# Patient Record
Sex: Male | Born: 1960 | Race: Black or African American | Hispanic: No | Marital: Married | Smoking: Current every day smoker
Health system: Southern US, Community
[De-identification: ages and names within clinical notes are randomized; demographics above are authoritative.]

## PROBLEM LIST (undated history)

## (undated) HISTORY — PX: ANAL FISSURECTOMY: SUR608

---

## 2004-10-14 ENCOUNTER — Emergency Department: Payer: Self-pay | Admitting: Emergency Medicine

## 2006-03-24 ENCOUNTER — Emergency Department: Payer: Self-pay | Admitting: Emergency Medicine

## 2006-04-13 ENCOUNTER — Emergency Department: Payer: Self-pay | Admitting: Unknown Physician Specialty

## 2006-12-30 ENCOUNTER — Emergency Department: Payer: Self-pay | Admitting: Emergency Medicine

## 2009-01-21 ENCOUNTER — Emergency Department: Payer: Self-pay | Admitting: Emergency Medicine

## 2009-04-22 ENCOUNTER — Emergency Department: Payer: Self-pay | Admitting: Emergency Medicine

## 2010-04-06 ENCOUNTER — Ambulatory Visit: Payer: Self-pay | Admitting: Internal Medicine

## 2010-11-09 ENCOUNTER — Emergency Department: Payer: Self-pay | Admitting: Emergency Medicine

## 2010-11-20 ENCOUNTER — Emergency Department: Payer: Self-pay | Admitting: Emergency Medicine

## 2011-03-04 ENCOUNTER — Encounter: Payer: Self-pay | Admitting: *Deleted

## 2011-03-04 ENCOUNTER — Emergency Department (HOSPITAL_COMMUNITY)
Admission: EM | Admit: 2011-03-04 | Discharge: 2011-03-04 | Disposition: A | Discharge status: Home / Self Care | Payer: BC Managed Care – PPO | Attending: Emergency Medicine | Admitting: Emergency Medicine

## 2011-03-04 DIAGNOSIS — R51 Headache: Secondary | ICD-10-CM

## 2011-03-04 DIAGNOSIS — W57XXXA Bitten or stung by nonvenomous insect and other nonvenomous arthropods, initial encounter: Secondary | ICD-10-CM

## 2011-03-04 DIAGNOSIS — F17200 Nicotine dependence, unspecified, uncomplicated: Secondary | ICD-10-CM | POA: Insufficient documentation

## 2011-03-04 DIAGNOSIS — S30860A Insect bite (nonvenomous) of lower back and pelvis, initial encounter: Secondary | ICD-10-CM | POA: Insufficient documentation

## 2011-03-04 MED ORDER — PROMETHAZINE HCL 25 MG PO TABS: 25.0000 mg | ORAL_TABLET | Freq: Four times a day (QID) | ORAL | Status: AC | PRN

## 2011-03-04 MED ORDER — DOXYCYCLINE HYCLATE 100 MG PO TABS
100.0000 mg | ORAL_TABLET | Freq: Once | ORAL | Status: AC
  Administered 2011-03-04: 100 mg via ORAL
  Filled 2011-03-04: qty 1

## 2011-03-04 MED ORDER — ONDANSETRON 4 MG PO TBDP
4.0000 mg | ORAL_TABLET | Freq: Once | ORAL | Status: AC
  Administered 2011-03-04: 4 mg via ORAL
  Filled 2011-03-04: qty 1

## 2011-03-04 MED ORDER — IBUPROFEN 800 MG PO TABS: 800.0000 mg | ORAL_TABLET | Freq: Three times a day (TID) | ORAL | Status: AC

## 2011-03-04 MED ORDER — DOXYCYCLINE HYCLATE 100 MG PO CAPS: 100.0000 mg | ORAL_CAPSULE | Freq: Two times a day (BID) | ORAL | Status: AC

## 2011-03-04 MED ORDER — IBUPROFEN 800 MG PO TABS
800.0000 mg | ORAL_TABLET | Freq: Once | ORAL | Status: AC
  Administered 2011-03-04: 800 mg via ORAL
  Filled 2011-03-04: qty 1

## 2011-03-04 NOTE — ED Notes (Signed)
States has had multiple tick bites all over body.  Now experiencing headaches, nausea, muscle and joint pain, generalized weakness x 1 wk.

## 2011-03-04 NOTE — ED Notes (Signed)
Pt has several tick bites on his lower abd and one on his pelvic area.

## 2011-03-04 NOTE — ED Provider Notes (Signed)
History     Chief Complaint  Patient presents with  . tick bite    HPI Comments: Patient states that approximately one to 2 weeks ago he suffered a tick bite to his right periumbilical area in his left groin. He was able to pull these ticks off and it wasn't until the last 2-3 days that he started to develop mild headaches, cold sweats, nausea. Symptoms are constant, nothing makes it better or worse, not associated with fevers, vomiting, diarrhea, rashes. Symptoms are mild at this time.  The history is provided by the patient.    History reviewed. No pertinent past medical history.  History reviewed. No pertinent past surgical history.  No family history on file.  History  Substance Use Topics  . Smoking status: Current Everyday Smoker  . Smokeless tobacco: Not on file  . Alcohol Use: No      Review of Systems  All other systems reviewed and are negative.    Physical Exam  BP 128/81  Pulse 78  Temp(Src) 100.2 F (37.9 C) (Oral)  Resp 18  Ht 6\' 2"  (1.88 m)  Wt 235 lb (106.595 kg)  BMI 30.17 kg/m2  SpO2 100%  Physical Exam  Nursing note and vitals reviewed. Constitutional: He appears well-developed and well-nourished. No distress.  HENT:  Head: Normocephalic and atraumatic.  Mouth/Throat: Oropharynx is clear and moist. No oropharyngeal exudate.  Eyes: Conjunctivae and EOM are normal. Pupils are equal, round, and reactive to light. Right eye exhibits no discharge. Left eye exhibits no discharge. No scleral icterus.  Neck: Normal range of motion. Neck supple. No JVD present. No thyromegaly present.  Cardiovascular: Normal rate, regular rhythm, normal heart sounds and intact distal pulses.  Exam reveals no gallop and no friction rub.   No murmur heard. Pulmonary/Chest: Effort normal and breath sounds normal. No respiratory distress. He has no wheezes. He has no rales.  Abdominal: Soft. Bowel sounds are normal. He exhibits no distension and no mass. There is no  tenderness.  Musculoskeletal: Normal range of motion. He exhibits no edema and no tenderness.  Lymphadenopathy:    He has no cervical adenopathy.  Neurological: He is alert. Coordination normal.  Skin: Skin is warm and dry. He is not diaphoretic. No erythema.       Small areas of scabbed over healing at the right periumbilical area. No signs of rash in the groin. No other rashes on the skin.  Psychiatric: He has a normal mood and affect. His behavior is normal.    ED Course  Procedures  MDM Overall patient appears well, normal blood pressure, normal pulse, temperature of 100.2. We'll have to treat with doxycycline for possible [facility] spotted fever, however patient appears well and is taking by mouth fluids and foods and medications as needed. Medicine for nausea given in the emergency department as well as doxycycline. Prescriptions for same for home. Patient instructed on indications for emergency return.      Vida Roller, MD 03/04/11 [phone]

## 2012-02-26 ENCOUNTER — Ambulatory Visit: Payer: Self-pay | Admitting: Emergency Medicine

## 2012-03-03 LAB — PATHOLOGY REPORT

## 2013-06-13 ENCOUNTER — Emergency Department: Payer: Self-pay | Admitting: Emergency Medicine

## 2014-08-16 ENCOUNTER — Emergency Department: Payer: Self-pay | Admitting: Emergency Medicine

## 2014-11-30 NOTE — Op Note (Signed)
PATIENT NAME:  Parker Conway, Parker Conway MR#:  [id number] DATE OF BIRTH:  05-23-1961  DATE OF PROCEDURE:  02/26/2012  PREOPERATIVE DIAGNOSIS: Hemorrhoids.   POSTOPERATIVE DIAGNOSIS: Hemorrhoids.   PROCEDURE: Hemorrhoidectomy.   SURGEON: Alden Feagan S. Phylis Bougie, MD  DESCRIPTION OF PROCEDURE: Patient had a spinal anesthesia. He was then put back in jackknife position. Rectal examination was performed. There was one hemorrhoid at 5:00 position, was pretty prolapsed and also had prolapsed mucosa. It was then picked up with an Allis clamp and suture ligated at the apex and after that with the Harmonic scalpel whole of the hemorrhoid was then removed and then the mucosa was then sutured back all the way down to the skin.   Another area at 9:00 position was also then grasped and was removed in similar fashion and ligated all the way down to the skin level. After that I did not see any other major ones inside so the procedure was then terminated and then rectum was then packed with Gelfoam gauze and Xylocaine viscous. Patient tolerated procedure well, sent to recovery room in satisfactory condition.   ____________________________ Welford Roche Phylis Bougie, MD msh:cms D: 02/26/2012 15:07:53 ET T: 02/26/2012 15:14:20 ET JOB#: [id number]  cc: Cailey Trigueros S. Phylis Bougie, MD, <Dictator>  Sharene Butters MD ELECTRONICALLY SIGNED 03/04/2012 9:35

## 2016-05-24 ENCOUNTER — Ambulatory Visit
Admission: RE | Admit: 2016-05-24 | Discharge: 2016-05-24 | Disposition: A | Discharge status: Home / Self Care | Payer: BLUE CROSS/BLUE SHIELD | Source: Ambulatory Visit | Attending: Internal Medicine | Admitting: Internal Medicine

## 2016-05-24 ENCOUNTER — Other Ambulatory Visit: Payer: Self-pay | Admitting: Internal Medicine

## 2016-05-24 DIAGNOSIS — R079 Chest pain, unspecified: Secondary | ICD-10-CM | POA: Diagnosis not present

## 2016-05-24 DIAGNOSIS — G43019 Migraine without aura, intractable, without status migrainosus: Secondary | ICD-10-CM | POA: Diagnosis not present

## 2016-05-24 DIAGNOSIS — R05 Cough: Secondary | ICD-10-CM | POA: Diagnosis not present

## 2016-05-24 DIAGNOSIS — R0789 Other chest pain: Secondary | ICD-10-CM | POA: Diagnosis not present

## 2016-05-24 DIAGNOSIS — R52 Pain, unspecified: Secondary | ICD-10-CM

## 2016-05-24 DIAGNOSIS — R091 Pleurisy: Secondary | ICD-10-CM | POA: Diagnosis not present

## 2016-05-24 DIAGNOSIS — E784 Other hyperlipidemia: Secondary | ICD-10-CM | POA: Diagnosis not present

## 2016-05-24 DIAGNOSIS — R454 Irritability and anger: Secondary | ICD-10-CM | POA: Diagnosis not present

## 2016-05-24 DIAGNOSIS — S83509A Sprain of unspecified cruciate ligament of unspecified knee, initial encounter: Secondary | ICD-10-CM | POA: Diagnosis not present

## 2016-05-29 DIAGNOSIS — G43019 Migraine without aura, intractable, without status migrainosus: Secondary | ICD-10-CM | POA: Diagnosis not present

## 2016-05-29 DIAGNOSIS — R091 Pleurisy: Secondary | ICD-10-CM | POA: Diagnosis not present

## 2016-05-29 DIAGNOSIS — R079 Chest pain, unspecified: Secondary | ICD-10-CM | POA: Diagnosis not present

## 2016-05-29 DIAGNOSIS — R454 Irritability and anger: Secondary | ICD-10-CM | POA: Diagnosis not present

## 2016-07-21 ENCOUNTER — Encounter: Payer: Self-pay | Admitting: Emergency Medicine

## 2016-07-21 ENCOUNTER — Emergency Department
Admission: EM | Admit: 2016-07-21 | Discharge: 2016-07-22 | Disposition: A | Discharge status: Home / Self Care | Payer: BLUE CROSS/BLUE SHIELD | Attending: Emergency Medicine | Admitting: Emergency Medicine

## 2016-07-21 ENCOUNTER — Emergency Department: Payer: BLUE CROSS/BLUE SHIELD

## 2016-07-21 DIAGNOSIS — Y92009 Unspecified place in unspecified non-institutional (private) residence as the place of occurrence of the external cause: Secondary | ICD-10-CM | POA: Diagnosis not present

## 2016-07-21 DIAGNOSIS — M25511 Pain in right shoulder: Secondary | ICD-10-CM | POA: Insufficient documentation

## 2016-07-21 DIAGNOSIS — S4991XA Unspecified injury of right shoulder and upper arm, initial encounter: Secondary | ICD-10-CM | POA: Diagnosis not present

## 2016-07-21 DIAGNOSIS — F17210 Nicotine dependence, cigarettes, uncomplicated: Secondary | ICD-10-CM | POA: Diagnosis not present

## 2016-07-21 DIAGNOSIS — W01198A Fall on same level from slipping, tripping and stumbling with subsequent striking against other object, initial encounter: Secondary | ICD-10-CM | POA: Diagnosis not present

## 2016-07-21 DIAGNOSIS — Y999 Unspecified external cause status: Secondary | ICD-10-CM | POA: Diagnosis not present

## 2016-07-21 DIAGNOSIS — Y9389 Activity, other specified: Secondary | ICD-10-CM | POA: Insufficient documentation

## 2016-07-21 MED ORDER — IBUPROFEN 800 MG PO TABS
ORAL_TABLET | ORAL | Status: AC
  Filled 2016-07-21: qty 1

## 2016-07-21 MED ORDER — IBUPROFEN 800 MG PO TABS
800.0000 mg | ORAL_TABLET | Freq: Once | ORAL | Status: AC
  Administered 2016-07-21: 800 mg via ORAL

## 2016-07-21 NOTE — ED Triage Notes (Signed)
Pt says he was doing some remodeling at his house yesterday; threw some linoleum outside that got covered up in the snow; slipped and fell on it earlier today and heard a popping noise in right clavicle area; tenderness on palpation; denies tenderness to shoulder;

## 2016-07-22 ENCOUNTER — Emergency Department: Payer: BLUE CROSS/BLUE SHIELD

## 2016-07-22 DIAGNOSIS — S4991XA Unspecified injury of right shoulder and upper arm, initial encounter: Secondary | ICD-10-CM | POA: Diagnosis not present

## 2016-07-22 DIAGNOSIS — M25511 Pain in right shoulder: Secondary | ICD-10-CM | POA: Diagnosis not present

## 2016-07-22 MED ORDER — LIDOCAINE 5 % EX PTCH
1.0000 | MEDICATED_PATCH | CUTANEOUS | Status: DC
  Administered 2016-07-22: 1 via TRANSDERMAL
  Filled 2016-07-22: qty 1

## 2016-07-22 MED ORDER — OXYCODONE-ACETAMINOPHEN 5-325 MG PO TABS
1.0000 | ORAL_TABLET | Freq: Four times a day (QID) | ORAL | 0 refills | Status: DC | PRN
Start: 2016-07-22 — End: 2020-02-08

## 2016-07-22 MED ORDER — TRAMADOL HCL 50 MG PO TABS
50.0000 mg | ORAL_TABLET | Freq: Once | ORAL | Status: AC
  Administered 2016-07-22: 50 mg via ORAL
  Filled 2016-07-22: qty 1

## 2016-07-22 MED ORDER — LIDOCAINE 5 % EX PTCH: 1.0000 | MEDICATED_PATCH | Freq: Two times a day (BID) | CUTANEOUS | 0 refills | Status: AC

## 2016-07-22 NOTE — ED Notes (Signed)
Pt back from x-ray.

## 2016-07-22 NOTE — ED Notes (Signed)
Pt alert and oriented X4, active, cooperative, pt in NAD. RR even and unlabored, color WNL.  Pt informed to return if any life threatening symptoms occur.   

## 2016-07-22 NOTE — Discharge Instructions (Signed)
Please follow up with the orthopedic surgeon for further evaluation.

## 2016-07-22 NOTE — ED Provider Notes (Signed)
[facility] Emergency Department Provider Note   ____________________________________________   First MD Initiated Contact with Patient 07/21/16 2350     (approximate)  I have reviewed the triage vital signs and the nursing notes.   HISTORY  Chief Complaint Clavicle Injury and Fall    HPI Parker Conway is a 55 y.o. male who comes into the hospital today with some right shoulder pain. The patient reports that he fell on his shoulder around 2 PM. He states that he had done some remodeling in his home and had thrown some linoleum outside. He reports that it was covered in some notes today and he didn't realize that when he went outside. He stepped on the snow covered linoleum flipped twice. He reports that the second time he fell and hit his shoulder and heard a pop. He has had pain since then. The patient tried laying down but did not take anything for pain and did not ice the shoulder. The patient rates his pain as a 10 out of 10 in intensity. He is able to move his hand and his fingers but reports that it hurts too much to move his shoulder. He states that it's in his right posterior shoulder where the pain is located. The patient denies hitting his head. He is here for evaluation.   History reviewed. No pertinent past medical history.  There are no active problems to display for this patient.   Past Surgical History:  Procedure Laterality Date  . ANAL FISSURECTOMY      Prior to Admission medications   Medication Sig Start Date End Date Taking? Authorizing Provider  lidocaine (LIDODERM) 5 % Place 1 patch onto the skin every 12 (twelve) hours. Remove & Discard patch within 12 hours or as directed by MD 07/22/16 07/22/17  Loney Hering, MD  oxyCODONE-acetaminophen (ROXICET) 5-325 MG tablet Take 1 tablet by mouth every 6 (six) hours as needed. 07/22/16   Loney Hering, MD    Allergies Patient has no known allergies.  History reviewed. No  pertinent family history.  Social History Social History  Substance Use Topics  . Smoking status: Current Every Day Smoker    Types: Cigarettes  . Smokeless tobacco: Never Used  . Alcohol use No    Review of Systems Constitutional: No fever/chills Eyes: No visual changes. ENT: No sore throat. Cardiovascular: Denies chest pain. Respiratory: Denies shortness of breath. Gastrointestinal: No abdominal pain.  No nausea, no vomiting.  No diarrhea.  No constipation. Genitourinary: Negative for dysuria. Musculoskeletal: Right shoulder pain Skin: Negative for rash. Neurological: Negative for headaches, focal weakness or numbness.  10-point ROS otherwise negative.  ____________________________________________   PHYSICAL EXAM:  VITAL SIGNS: ED Triage Vitals [07/21/16 2333]  Enc Vitals Group     BP (!) 156/85     Pulse Rate 71     Resp 20     Temp 98 F (36.7 C)     Temp Source Oral     SpO2 96 %     Weight 235 lb (106.6 kg)     Height 6\' 2"  (1.88 m)     Head Circumference      Peak Flow      Pain Score 10     Pain Loc      Pain Edu?      Excl. in [place]?     Constitutional: Alert and oriented. Well appearing and in mild distress. Eyes: Conjunctivae are normal. PERRL. EOMI. Head: Atraumatic. Nose: No  congestion/rhinnorhea. Mouth/Throat: Mucous membranes are moist.  Oropharynx non-erythematous. Cardiovascular: Normal rate, regular rhythm. Grossly normal heart sounds.  Good peripheral circulation. Respiratory: Normal respiratory effort.  No retractions. Lungs CTAB. Gastrointestinal: Soft and nontender. No distention. Positive bowel sounds Musculoskeletal: Tenderness to palpation of right anterior shoulder over the acromioclavicular joint. Pain with passive range of motion  Neurologic:  Normal speech and language.  Skin:  Skin is warm, dry and intact.  Psychiatric: Mood and affect are normal.  ____________________________________________   LABS (all labs ordered are  listed, but only abnormal results are displayed)  Labs Reviewed - No data to display ____________________________________________  EKG  none ____________________________________________  RADIOLOGY  Right clavicle xray Right shoulder xray ____________________________________________   PROCEDURES  Procedure(s) performed: None  Procedures  Critical Care performed: No  ____________________________________________   INITIAL IMPRESSION / ASSESSMENT AND PLAN / ED COURSE  Pertinent labs & imaging results that were available during my care of the patient were reviewed by me and considered in my medical decision making (see chart for details).  This is a 55 year old male who comes into the hospital today with some right-sided shoulder pain after a fall on the ice. The patient did receive a clavicle x-ray that was unremarkable I will also send the patient for right shoulder x-ray. I will give him some tramadol and place a Lidoderm patch to his shoulder.  Clinical Course as of Jul 22 110  Sat Jul 21, 2016  2356 No right clavicle fracture DG Clavicle Right [AW]  Sun Jul 22, 2016  0108 No fracture or subluxation of the shoulder.  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   DG Shoulder Right [AW]    Clinical Course User Index [AW] Loney Hering, MD   The patient will be discharged to home to follow-up with the orthopedic surgeon. He has no acute fracture but again I am concerned about a possible acromioclavicular joint separation. I discussed this with the patient. He was placed in a sling and he will be discharged.  ____________________________________________   FINAL CLINICAL IMPRESSION(S) / ED DIAGNOSES  Final diagnoses:  Acute pain of right shoulder      NEW MEDICATIONS STARTED DURING THIS VISIT:  New Prescriptions   LIDOCAINE (LIDODERM) 5 %    Place 1 patch onto the skin every 12 (twelve) hours. Remove & Discard patch within  12 hours or as directed by MD   OXYCODONE-ACETAMINOPHEN (ROXICET) 5-325 MG TABLET    Take 1 tablet by mouth every 6 (six) hours as needed.     Note:  This document was prepared using Dragon voice recognition software and may include unintentional dictation errors.    Loney Hering, MD 07/22/16 0111

## 2017-12-31 DIAGNOSIS — E7849 Other hyperlipidemia: Secondary | ICD-10-CM | POA: Diagnosis not present

## 2017-12-31 DIAGNOSIS — R454 Irritability and anger: Secondary | ICD-10-CM | POA: Diagnosis not present

## 2017-12-31 DIAGNOSIS — I10 Essential (primary) hypertension: Secondary | ICD-10-CM | POA: Diagnosis not present

## 2017-12-31 DIAGNOSIS — Z125 Encounter for screening for malignant neoplasm of prostate: Secondary | ICD-10-CM | POA: Diagnosis not present

## 2017-12-31 DIAGNOSIS — E034 Atrophy of thyroid (acquired): Secondary | ICD-10-CM | POA: Diagnosis not present

## 2017-12-31 DIAGNOSIS — S83509A Sprain of unspecified cruciate ligament of unspecified knee, initial encounter: Secondary | ICD-10-CM | POA: Diagnosis not present

## 2017-12-31 DIAGNOSIS — E785 Hyperlipidemia, unspecified: Secondary | ICD-10-CM | POA: Diagnosis not present

## 2017-12-31 DIAGNOSIS — R5381 Other malaise: Secondary | ICD-10-CM | POA: Diagnosis not present

## 2017-12-31 DIAGNOSIS — G43019 Migraine without aura, intractable, without status migrainosus: Secondary | ICD-10-CM | POA: Diagnosis not present

## 2018-01-30 DIAGNOSIS — E785 Hyperlipidemia, unspecified: Secondary | ICD-10-CM | POA: Diagnosis not present

## 2018-01-30 DIAGNOSIS — M12811 Other specific arthropathies, not elsewhere classified, right shoulder: Secondary | ICD-10-CM | POA: Diagnosis not present

## 2018-01-30 DIAGNOSIS — N529 Male erectile dysfunction, unspecified: Secondary | ICD-10-CM | POA: Diagnosis not present

## 2018-01-30 DIAGNOSIS — G43019 Migraine without aura, intractable, without status migrainosus: Secondary | ICD-10-CM | POA: Diagnosis not present

## 2018-01-30 DIAGNOSIS — R454 Irritability and anger: Secondary | ICD-10-CM | POA: Diagnosis not present

## 2018-03-30 DIAGNOSIS — S025XXB Fracture of tooth (traumatic), initial encounter for open fracture: Secondary | ICD-10-CM | POA: Diagnosis not present

## 2018-03-30 DIAGNOSIS — K0889 Other specified disorders of teeth and supporting structures: Secondary | ICD-10-CM | POA: Diagnosis not present

## 2020-02-04 ENCOUNTER — Emergency Department
Admission: EM | Admit: 2020-02-04 | Discharge: 2020-02-04 | Disposition: A | Discharge status: Home / Self Care | Payer: 59 | Attending: Emergency Medicine | Admitting: Emergency Medicine

## 2020-02-04 ENCOUNTER — Encounter: Payer: Self-pay | Admitting: Emergency Medicine

## 2020-02-04 ENCOUNTER — Other Ambulatory Visit: Payer: Self-pay

## 2020-02-04 DIAGNOSIS — F17210 Nicotine dependence, cigarettes, uncomplicated: Secondary | ICD-10-CM | POA: Insufficient documentation

## 2020-02-04 DIAGNOSIS — Z136 Encounter for screening for cardiovascular disorders: Secondary | ICD-10-CM | POA: Insufficient documentation

## 2020-02-04 DIAGNOSIS — Z0000 Encounter for general adult medical examination without abnormal findings: Secondary | ICD-10-CM

## 2020-02-04 LAB — BASIC METABOLIC PANEL
Anion gap: 9 (ref 5–15)
BUN: 10 mg/dL (ref 6–20)
CO2: 26 mmol/L (ref 22–32)
Calcium: 8.9 mg/dL (ref 8.9–10.3)
Chloride: 106 mmol/L (ref 98–111)
Creatinine, Ser: 0.9 mg/dL (ref 0.61–1.24)
GFR calc Af Amer: >60 mL/min (ref >60)
GFR calc non Af Amer: >60 mL/min (ref >60)
Glucose, Bld: 88 mg/dL (ref 70–99)
Potassium: 4.4 mmol/L (ref 3.5–5.1)
Sodium: 141 mmol/L (ref 135–145)

## 2020-02-04 LAB — TROPONIN I (HIGH SENSITIVITY): Troponin I (High Sensitivity): 5 ng/L (ref <18)

## 2020-02-04 NOTE — ED Triage Notes (Signed)
Pt to ED via POV stating that he was sent by his dentist. Pt reports that he was having dental work done this morning, they had him on a cardiac monitor and they told him that it looked like he was having a heart attack. Pt brought tracing in from dentist. Pt states that he is not having any chest pain, nor has he had chest pain at any point.

## 2020-02-04 NOTE — ED Notes (Signed)
See triage note, pt reports he was at dentist office and the dentist said he had "a heart attack or bad EKG". Denies CP, SHOB. Pt in NAD

## 2020-02-04 NOTE — ED Provider Notes (Signed)
[facility] Emergency Department Provider Note  EKG ____________________________________________   First MD Initiated Contact with Patient 02/04/20 1435     (approximate) I have reviewed the triage vital signs and the nursing notes.   HISTORY  Chief Complaint Sent by dentist    HPI MATIN MATTIOLI is a 59 y.o. male patient arrived via POV from  dental office.  Patient state he was undergoing dental procedure and was told on a cardiac monitor look like he was having a heart attack.  Patient denies chest pain or shortness of breath.  Patient also denies history of chest pain or dyspnea.  Patient presents with rhythm strip from the dental office which was evaluated by Dr. Cherylann Banas with no acute findings.  Patient is amenable to cardiac work-up.         History reviewed. No pertinent past medical history.  There are no problems to display for this patient.   Past Surgical History:  Procedure Laterality Date  . ANAL FISSURECTOMY      Prior to Admission medications   Medication Sig Start Date End Date Taking? Authorizing Provider  oxyCODONE-acetaminophen (ROXICET) 5-325 MG tablet Take 1 tablet by mouth every 6 (six) hours as needed. 07/22/16   Loney Hering, MD    Allergies Patient has no known allergies.  No family history on file.  Social History Social History   Tobacco Use  . Smoking status: Current Every Day Smoker    Types: Cigarettes  . Smokeless tobacco: Never Used  Substance Use Topics  . Alcohol use: No  . Drug use: No    Review of Systems Constitutional: No fever/chills Eyes: No visual changes. ENT: No sore throat. Cardiovascular: Denies chest pain. Respiratory: Denies shortness of breath. Gastrointestinal: No abdominal pain.  No nausea, no vomiting.  No diarrhea.  No constipation. Genitourinary: Negative for dysuria. Musculoskeletal: Negative for back pain. Skin: Negative for rash. Neurological: Negative for  headaches, focal weakness or numbness.   ____________________________________________   PHYSICAL EXAM:  VITAL SIGNS: ED Triage Vitals [02/04/20 1248]  Enc Vitals Group     BP (!) 144/86     Pulse Rate (!) 57     Resp 16     Temp 97.8 F (36.6 C)     Temp Source Oral     SpO2 100 %     Weight 210 lb (95.3 kg)     Height 6\' 2"  (1.88 m)     Head Circumference      Peak Flow      Pain Score 0     Pain Loc      Pain Edu?      Excl. in [place]?    Constitutional: Alert and oriented. Well appearing and in no acute distress. Cardiovascular: Bradycardic, regular rhythm. Grossly normal heart sounds.  Good peripheral circulation. Respiratory: Normal respiratory effort.  No retractions. Lungs CTAB. Gastrointestinal: Soft and nontender. No distention. No abdominal bruits. No CVA tenderness. Musculoskeletal: No lower extremity tenderness nor edema.  No joint effusions. Neurologic:  Normal speech and language. No gross focal neurologic deficits are appreciated. No gait instability. Skin:  Skin is warm, dry and intact. No rash noted. Psychiatric: Mood and affect are normal. Speech and behavior are normal.  ____________________________________________   LABS (all labs ordered are listed, but only abnormal results are displayed)  Labs Reviewed  BASIC METABOLIC PANEL  CBC WITH DIFFERENTIAL/PLATELET  CBC WITH DIFFERENTIAL/PLATELET  SEDIMENTATION RATE  TROPONIN I (HIGH SENSITIVITY)  TROPONIN I (HIGH  SENSITIVITY)   ____________________________________________  EKG EKG read by heart station doctors unremarkable except for  bradycardia. ____________________________________________  RADIOLOGY  ED MD interpretation:    Official radiology report(s): No results found.  ____________________________________________   PROCEDURES  Procedure(s) performed (including Critical Care):  Procedures   ____________________________________________   INITIAL IMPRESSION / ASSESSMENT AND  PLAN / ED COURSE  As part of my medical decision making, I reviewed the following data within the [place]     Patient presents for evaluation for heart attack.  Referral from dentist office today.  Patient states he was surprised by this diagnosis since he is having no chest pain or shortness of breath.  Patient denies previous chest pain or dyspnea.  Discussed EKG reading with patient which showed asymptomatic bradycardia.  EKG monitoring was on eventful doing his stay.  Discussed lab results with patient was not consistent with heart attack.  Patient has advised to establish care with PCP for health maintenance.   DAEMYN GARIEPY was evaluated in Emergency Department on 02/04/2020 for the symptoms described in the history of present illness. He was evaluated in the context of the global COVID-19 pandemic, which necessitated consideration that the patient might be at risk for infection with the SARS-CoV-2 virus that causes COVID-19. Institutional protocols and algorithms that pertain to the evaluation of patients at risk for COVID-19 are in a state of rapid change based on information released by regulatory bodies including the CDC and federal and state organizations. These policies and algorithms were followed during the patient's care in the ED.       ____________________________________________   FINAL CLINICAL IMPRESSION(S) / ED DIAGNOSES  Final diagnoses:  Normal cardiac exam     ED Discharge Orders    None       Note:  This document was prepared using Dragon voice recognition software and may include unintentional dictation errors.    Sable Feil, PA-C 02/04/20 1641    Arta Silence, MD 02/05/20 [phone]

## 2020-02-04 NOTE — Discharge Instructions (Addendum)
Your EKG, cardiac monitor, and cardiac labs are unremarkable.  Advised establish care with PCP for health maintenance.

## 2020-02-05 NOTE — Telephone Encounter (Signed)
Hi Parker Conway have printed both of these off for Dr Lavera Guise to review and he will have both of them when he comes in Monday

## 2020-02-08 ENCOUNTER — Ambulatory Visit: Payer: 59 | Admitting: Internal Medicine

## 2020-02-08 ENCOUNTER — Encounter: Payer: Self-pay | Admitting: Internal Medicine

## 2020-02-08 ENCOUNTER — Other Ambulatory Visit: Payer: Self-pay

## 2020-02-08 ENCOUNTER — Ambulatory Visit (INDEPENDENT_AMBULATORY_CARE_PROVIDER_SITE_OTHER): Payer: 59 | Admitting: Internal Medicine

## 2020-02-08 VITALS — BP 116/72 | HR 77 | Ht 74.0 in | Wt 193.2 lb

## 2020-02-08 DIAGNOSIS — Z01810 Encounter for preprocedural cardiovascular examination: Secondary | ICD-10-CM | POA: Diagnosis not present

## 2020-02-08 DIAGNOSIS — M1710 Unilateral primary osteoarthritis, unspecified knee: Secondary | ICD-10-CM

## 2020-02-08 DIAGNOSIS — Z0000 Encounter for general adult medical examination without abnormal findings: Secondary | ICD-10-CM | POA: Insufficient documentation

## 2020-02-08 NOTE — Progress Notes (Addendum)
Established Patient Office Visit  SUBJECTIVE:  Subjective  Patient ID: Parker Conway, male    DOB: [birth year]  Age: 59 y.o. MRN: [id number]  CC:  Chief Complaint  Patient presents with  . Follow-up    after elevated bp during dental procedure     HPI Parker Conway is a 59 y.o. male presenting today for ED follow-up.  He was getting a dental procedure, 7 teeth pulled for dentures, on 02/04/2020 and was on a cardiac monitor which showed him having a heart attack before the anesthesia was introduced. He went to [facility] to be evaluated for MI, but the physical exam and repeat EKG were unremarkable.   Today he feels fine, though he is angry with the circumstances following the dental procedure and having to go to the ED. He reports feeling well overall, though he does report irritation and anxiety occasionally. He has never had any chest pain or heart issues or palpitations. He has never had an episode of syncope. The right knee pain radiates to his hip and down into his foot, and the steroid injection helps him stand but does not alleviate his pain; he takes 2-3 Aleve tablets when the knee pain is bad and it helps alleviate the pain slightly.   He still smokes 1/2 PPD and marijuana occasionally. He denies illicit drug use. He had a colonoscopy done in 2013. 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  History reviewed. No pertinent past medical history.  Past Surgical History:  Procedure Laterality Date  . ANAL FISSURECTOMY      History reviewed. No pertinent family history.  Social History   Socioeconomic History  . Marital status: Married    Spouse name: Not on file  . Number of children: Not on file  . Years of education: Not on file  . Highest education level: Not on file  Occupational History  . Not on file  Tobacco Use  . Smoking status: Current Every Day Smoker    Types: Cigarettes  . Smokeless tobacco: Never Used  Substance and Sexual Activity    . Alcohol use: No  . Drug use: No  . Sexual activity: Not on file  Other Topics Concern  . Not on file  Social History Narrative  . Not on file   Social Determinants of Health   Financial Resource Strain:   . Difficulty of Paying Living Expenses:   Food Insecurity:   . Worried About Charity fundraiser in the Last Year:   . Arboriculturist in the Last Year:   Transportation Needs:   . Film/video editor (Medical):   Marland Kitchen Lack of Transportation (Non-Medical):   Physical Activity:   . Days of Exercise per Week:   . Minutes of Exercise per Session:   Stress:   . Feeling of Stress :   Social Connections:   . Frequency of Communication with Friends and Family:   . Frequency of Social Gatherings with Friends and Family:   . Attends Religious Services:   . Active Member of Clubs or Organizations:   . Attends Archivist Meetings:   Marland Kitchen Marital Status:   Intimate Partner Violence:   . Fear of Current or Ex-Partner:   . Emotionally Abused:   Marland Kitchen Physically Abused:   . Sexually Abused:      Current Outpatient Medications:  .  naproxen sodium (ALEVE) 220 MG tablet, Take 220 mg by mouth., Disp: , Rfl:  No Known Allergies  ROS Review of Systems  Constitutional: Negative.   HENT: Negative.  Negative for trouble swallowing.   Eyes: Negative.   Respiratory: Negative.  Negative for chest tightness and shortness of breath.   Cardiovascular: Negative.  Negative for chest pain.  Gastrointestinal: Negative.  Negative for abdominal pain.  Endocrine: Negative.   Genitourinary: Negative.  Negative for difficulty urinating.  Musculoskeletal: Negative.   Skin: Negative.   Allergic/Immunologic: Negative.   Neurological: Negative.   Hematological: Negative.   Psychiatric/Behavioral: Positive for agitation (occasional). Negative for dysphoric mood. The patient is nervous/anxious (occasional).   All other systems reviewed and are negative.    OBJECTIVE:    Physical  Exam Vitals reviewed.  Constitutional:      Appearance: Normal appearance.  Neck:     Vascular: No carotid bruit.  Cardiovascular:     Rate and Rhythm: Normal rate and regular rhythm.     Pulses: Normal pulses.     Heart sounds: Normal heart sounds.  Pulmonary:     Effort: Pulmonary effort is normal.     Breath sounds: Normal breath sounds.  Abdominal:     General: Bowel sounds are normal.     Palpations: Abdomen is soft. There is no hepatomegaly or splenomegaly.     Tenderness: There is no abdominal tenderness.     Hernia: No hernia is present.  Musculoskeletal:     Right knee: Deformity present. No bony tenderness. No tenderness.     Right lower leg: No edema.     Left lower leg: No edema.  Skin:    Findings: No rash.  Neurological:     Mental Status: He is alert and oriented to person, place, and time.  Psychiatric:        Mood and Affect: Mood normal.        Behavior: Behavior normal.     BP 116/72   Pulse 77   Ht 6\' 2"  (1.88 m)   Wt 193 lb 3.2 oz (87.6 kg)   BMI 24.81 kg/m  Wt Readings from Last 3 Encounters:  02/08/20 193 lb 3.2 oz (87.6 kg)  02/04/20 210 lb (95.3 kg)  07/21/16 235 lb (106.6 kg)    Health Maintenance Due  Topic Date Due  . Hepatitis C Screening  Never done  . COVID-19 Vaccine (1) Never done  . HIV Screening  Never done  . TETANUS/TDAP  Never done  . COLONOSCOPY  Never done    There are no preventive care reminders to display for this patient.  No flowsheet data found. CMP Latest Ref Rng & Units 02/04/2020  Glucose 70 - 99 mg/dL 88  BUN 6 - 20 mg/dL 10  Creatinine 0.61 - 1.24 mg/dL 0.90  Sodium 135 - 145 mmol/L 141  Potassium 3.5 - 5.1 mmol/L 4.4  Chloride 98 - 111 mmol/L 106  CO2 22 - 32 mmol/L 26  Calcium 8.9 - 10.3 mg/dL 8.9    No results found for: TSH Lab Results  Component Value Date   ANIONGAP 9 02/04/2020   No results found for: CHOL, HDL, LDLCALC, CHOLHDL No results found for: TRIG No results found for: HGBA1C     ASSESSMENT & PLAN:   Problem List Items Addressed This Visit      Musculoskeletal and Integument   Knee arthropathy    Patient is agreeable to go to the orthopedic surgeon on that evaluation of the right knee.  And have different kind of shots and think about her right  knee replacement  Patient also needs a colonoscopy as  he was found to have polyp in  past.        Other   Preop cardiovascular exam - Primary    Patient went to his dentist extraction of 7 teeth.  Dental assistant put the patient on the monitor and found some irregularity of the EKG.  He was sent to the emergency room to make sure he is not having heart attack or any further arrhythmia.  troponin was normal.  EKG was unremarkable.  Patient came to my office for cardiac clearance before undergoing teeth extraction  and he should have a echo and a stress he declined and wanted to do it after teeth extraction.  I do not see any abnormality on the EKG and thought patient decision was appropriate and he can proceed ahead with extraction of the teeth.  I recommended to the dental surgeon not to use any epinephrine because it might trigger tachycardia.         No orders of the defined types were placed in this encounter.  Preop cardiovascular exam  Knee arthropathy    Follow-up: No follow-ups on file.   Electrocardiogram report from the ED.  Electrocardiogram revealed normal sinus rhythm, no acute changes.  No arrhythmia is noticed.   Dr. Jane Canary [facility] [address], [place], [place] [address]   By signing my name below, I, Milinda Antis, attest that this documentation has been prepared under the direction and in the presence of Cletis Athens, MD. Electronically Signed: Cletis Athens, MD 02/10/20, 10:58 AM   I personally performed the services described in this documentation, which was SCRIBED in my presence. The recorded information has been reviewed and considered accurate. It has been  edited as necessary during review. Cletis Athens, MD

## 2020-02-08 NOTE — Assessment & Plan Note (Signed)
Patient went to his dentist extraction of 7 teeth.  Dental assistant put the patient on the monitor and found some irregularity of the EKG.  He was sent to the emergency room to make sure he is not having heart attack or any further arrhythmia.  troponin was normal.  EKG was unremarkable.  Patient came to my office for cardiac clearance before undergoing teeth extraction  and he should have a echo and a stress he declined and wanted to do it after teeth extraction.  I do not see any abnormality on the EKG and thought patient decision was appropriate and he can proceed ahead with extraction of the teeth.  I recommended to the dental surgeon not to use any epinephrine because it might trigger tachycardia.

## 2020-02-08 NOTE — Assessment & Plan Note (Addendum)
Patient is agreeable to go to the orthopedic surgeon on that evaluation of the right knee.  And have different kind of shots and think about her right knee replacement  Patient also needs a colonoscopy as  he was found to have polyp in  past.

## 2020-02-10 NOTE — Addendum Note (Signed)
Addended by: Cletis Athens on: 02/10/2020 11:05 AM   Modules accepted: Level of Service

## 2020-05-02 ENCOUNTER — Ambulatory Visit (INDEPENDENT_AMBULATORY_CARE_PROVIDER_SITE_OTHER): Payer: 59 | Admitting: Internal Medicine

## 2020-05-02 ENCOUNTER — Other Ambulatory Visit: Payer: Self-pay

## 2020-05-02 ENCOUNTER — Encounter: Payer: Self-pay | Admitting: Internal Medicine

## 2020-05-02 VITALS — BP 159/85 | HR 71 | Ht 74.0 in | Wt 184.0 lb

## 2020-05-02 DIAGNOSIS — R112 Nausea with vomiting, unspecified: Secondary | ICD-10-CM | POA: Diagnosis not present

## 2020-05-02 DIAGNOSIS — M542 Cervicalgia: Secondary | ICD-10-CM | POA: Diagnosis not present

## 2020-05-02 DIAGNOSIS — R55 Syncope and collapse: Secondary | ICD-10-CM | POA: Diagnosis not present

## 2020-05-02 DIAGNOSIS — R197 Diarrhea, unspecified: Secondary | ICD-10-CM

## 2020-05-02 DIAGNOSIS — R079 Chest pain, unspecified: Secondary | ICD-10-CM | POA: Diagnosis not present

## 2020-05-02 LAB — POC COVID19 BINAXNOW: SARS Coronavirus 2 Ag: NEGATIVE

## 2020-05-02 MED ORDER — PROMETHAZINE HCL 12.5 MG PO TABS: 12.5000 mg | ORAL_TABLET | Freq: Three times a day (TID) | ORAL | 0 refills | Status: DC | PRN

## 2020-05-02 NOTE — Assessment & Plan Note (Signed)
There is no history of injury to the neck he can move his neck forward backward and on the side there is no bruise.

## 2020-05-02 NOTE — Assessment & Plan Note (Signed)
Patient has been complaining of nausea also has been having diarrhea 2-3 times a day.  Occasionally he is complaining diarrhea to be kind of blackish.  There is no definite blood in the stool he does not give any history of colonoscopy or endoscopy in the recent past.

## 2020-05-02 NOTE — Assessment & Plan Note (Signed)
Patient has a near syncope 1 week ago he lacerated the forehead  there is no seizure disorder.  He denies any chest pain or shortness of breath there is no swelling of the legs.   He however gives a history of chest pain about a month ago did not go to the hospital at that time.

## 2020-05-02 NOTE — Progress Notes (Signed)
Established Patient Office Visit  Subjective:  Patient ID: Parker Conway, male    DOB: May 05, 1961  Age: 59 y.o. MRN: [id number]  CC:  Chief Complaint  Patient presents with  . Abdominal Pain    nausea, diarrhea, syncope x 1 week     HPI  Parker Conway presents for diarrhea  Wt loss and nausea Patient complaining of diarrhea 2-3 times a day sometimes it is blackish in color.he  Is also lost weight.  About 20 pounds.  Denies any history of vomiting of blood but complains nausea patient also has a history of near passing out spell.  He passed out at home   he did not go to the ER  there is no history of seizure activity but he bumped his head has a laceration on the top of his forehead.   He denies any history of chest pain recently but had a history of chest pain a month ago. he  Did not go to the hospital at that time.  He occasionally complains of double vision.  Personal history smoke 1 pack/day does not drink.   We will plan to get a CBC  Met b  TSH CT of the head and chest x-ray   History reviewed. No pertinent past medical history.  Past Surgical History:  Procedure Laterality Date  . ANAL FISSURECTOMY      History reviewed. No pertinent family history.  Social History   Socioeconomic History  . Marital status: Married    Spouse name: Not on file  . Number of children: Not on file  . Years of education: Not on file  . Highest education level: Not on file  Occupational History  . Not on file  Tobacco Use  . Smoking status: Current Every Day Smoker    Types: Cigarettes  . Smokeless tobacco: Never Used  Substance and Sexual Activity  . Alcohol use: No  . Drug use: No  . Sexual activity: Not on file  Other Topics Concern  . Not on file  Social History Narrative  . Not on file   Social Determinants of Health   Financial Resource Strain:   . Difficulty of Paying Living Expenses: Not on file  Food Insecurity:   . Worried About Charity fundraiser in  the Last Year: Not on file  . Ran Out of Food in the Last Year: Not on file  Transportation Needs:   . Lack of Transportation (Medical): Not on file  . Lack of Transportation (Non-Medical): Not on file  Physical Activity:   . Days of Exercise per Week: Not on file  . Minutes of Exercise per Session: Not on file  Stress:   . Feeling of Stress : Not on file  Social Connections:   . Frequency of Communication with Friends and Family: Not on file  . Frequency of Social Gatherings with Friends and Family: Not on file  . Attends Religious Services: Not on file  . Active Member of Clubs or Organizations: Not on file  . Attends Archivist Meetings: Not on file  . Marital Status: Not on file  Intimate Partner Violence:   . Fear of Current or Ex-Partner: Not on file  . Emotionally Abused: Not on file  . Physically Abused: Not on file  . Sexually Abused: Not on file     Current Outpatient Medications:  .  naproxen sodium (ALEVE) 220 MG tablet, Take 220 mg by mouth., Disp: , Rfl:  .  promethazine (PHENERGAN) 12.5 MG tablet, Take 1 tablet (12.5 mg total) by mouth every 8 (eight) hours as needed for nausea or vomiting., Disp: 20 tablet, Rfl: 0   No Known Allergies  ROS Review of Systems  Constitutional: Negative.   HENT: Negative.   Eyes: Negative.   Respiratory: Negative.   Cardiovascular: Negative.   Gastrointestinal: Negative.   Endocrine: Negative.   Genitourinary: Negative.   Musculoskeletal: Positive for arthralgias and joint swelling.       Complain of both knee pain right more than the left.  Skin: Negative.   Allergic/Immunologic: Negative.   Neurological: Negative.        There is a laceration on the top of the forehead.  Hematological: Negative.   Psychiatric/Behavioral: Negative.  Negative for agitation and behavioral problems.  All other systems reviewed and are negative.     Objective:    Physical Exam Vitals reviewed.  Constitutional:       Appearance: Normal appearance.  HENT:     Mouth/Throat:     Mouth: Mucous membranes are moist.  Eyes:     Pupils: Pupils are equal, round, and reactive to light.  Neck:     Vascular: No carotid bruit.  Cardiovascular:     Rate and Rhythm: Normal rate and regular rhythm.     Pulses: Normal pulses.     Heart sounds: Normal heart sounds.  Pulmonary:     Effort: Pulmonary effort is normal.     Breath sounds: Normal breath sounds.  Abdominal:     General: Bowel sounds are normal.     Palpations: Abdomen is soft. There is no hepatomegaly, splenomegaly or mass.     Tenderness: There is no abdominal tenderness.     Hernia: No hernia is present.  Musculoskeletal:     Cervical back: Neck supple.     Right lower leg: No edema.     Left lower leg: No edema.  Skin:    Findings: No rash.  Neurological:     Mental Status: He is alert and oriented to person, place, and time.     Motor: No weakness.  Psychiatric:        Mood and Affect: Mood normal.        Behavior: Behavior normal.     BP (!) 159/85   Pulse 71   Ht 6' 2" (1.88 m)   Wt 184 lb (83.5 kg)   BMI 23.62 kg/m  Wt Readings from Last 3 Encounters:  05/02/20 184 lb (83.5 kg)  02/08/20 193 lb 3.2 oz (87.6 kg)  02/04/20 210 lb (95.3 kg)     Health Maintenance Due  Topic Date Due  . Hepatitis C Screening  Never done  . COVID-19 Vaccine (1) Never done  . HIV Screening  Never done  . TETANUS/TDAP  Never done  . COLONOSCOPY  Never done  . INFLUENZA VACCINE  Never done    There are no preventive care reminders to display for this patient.  No results found for: TSH No results found for: WBC, HGB, HCT, MCV, PLT Lab Results  Component Value Date   NA 141 02/04/2020   K 4.4 02/04/2020   CO2 26 02/04/2020   GLUCOSE 88 02/04/2020   BUN 10 02/04/2020   CREATININE 0.90 02/04/2020   CALCIUM 8.9 02/04/2020   ANIONGAP 9 02/04/2020   No results found for: CHOL No results found for: HDL No results found for: LDLCALC No  results found for: TRIG No results found for: CHOLHDL  No results found for: HGBA1C    Assessment & Plan:   Problem List Items Addressed This Visit      Cardiovascular and Mediastinum   Near syncope    Patient has a near syncope 1 week ago he lacerated the forehead  there is no seizure disorder.  He denies any chest pain or shortness of breath there is no swelling of the legs.   He however gives a history of chest pain about a month ago did not go to the hospital at that time.      Relevant Orders   CT Head Wo Contrast   CT Soft Tissue Neck W Contrast     Digestive   Nausea and vomiting - Primary    Patient has been complaining of nausea also has been having diarrhea 2-3 times a day.  Occasionally he is complaining diarrhea to be kind of blackish.  There is no definite blood in the stool he does not give any history of colonoscopy or endoscopy in the recent past.      Relevant Orders   POC COVID-19 (Completed)   CBC with Differential/Platelet   COMPLETE METABOLIC PANEL WITH GFR   TSH     Other   Diarrhea    Diarrhea about 3 times a day day we will check electrolyte and plan for colonoscopy      Relevant Orders   Ambulatory referral to Gastroenterology   Chest pain    Complain of chest pain non radiating to the left shoulder or arm.  It happened about a month ago and there is no recurrence.      Relevant Orders   DG Chest 2 View   Cervicalgia    There is no history of injury to the neck he can move his neck forward backward and on the side there is no bruise.      Relevant Orders   CT Soft Tissue Neck W Contrast      Meds ordered this encounter  Medications  . promethazine (PHENERGAN) 12.5 MG tablet    Sig: Take 1 tablet (12.5 mg total) by mouth every 8 (eight) hours as needed for nausea or vomiting.    Dispense:  20 tablet    Refill:  0    Follow-up: No follow-ups on file.    Cletis Athens, MD

## 2020-05-02 NOTE — Assessment & Plan Note (Signed)
Complain of chest pain non radiating to the left shoulder or arm.  It happened about a month ago and there is no recurrence.

## 2020-05-02 NOTE — Assessment & Plan Note (Signed)
Diarrhea about 3 times a day day we will check electrolyte and plan for colonoscopy

## 2020-05-03 LAB — COMPLETE METABOLIC PANEL WITH GFR
AG Ratio: 2.3 (calc) (ref 1.0–2.5)
ALT: 8 U/L — ABNORMAL LOW (ref 9–46)
AST: 11 U/L (ref 10–35)
Albumin: 4.1 g/dL (ref 3.6–5.1)
Alkaline phosphatase (APISO): 72 U/L (ref 35–144)
BUN: 13 mg/dL (ref 7–25)
CO2: 22 mmol/L (ref 20–32)
Calcium: 9.1 mg/dL (ref 8.6–10.3)
Chloride: 108 mmol/L (ref 98–110)
Creat: 1 mg/dL (ref 0.70–1.33)
GFR, Est African American: 96 mL/min/{1.73_m2} (ref >=60)
GFR, Est Non African American: 83 mL/min/{1.73_m2} (ref >=60)
Globulin: 1.8 g/dL (calc) — ABNORMAL LOW (ref 1.9–3.7)
Glucose, Bld: 96 mg/dL (ref 65–99)
Potassium: 4.6 mmol/L (ref 3.5–5.3)
Sodium: 142 mmol/L (ref 135–146)
Total Bilirubin: 0.3 mg/dL (ref 0.2–1.2)
Total Protein: 5.9 g/dL — ABNORMAL LOW (ref 6.1–8.1)

## 2020-05-03 LAB — CBC WITH DIFFERENTIAL/PLATELET
Absolute Monocytes: 859 cells/uL (ref 200–950)
Basophils Absolute: 68 cells/uL (ref 0–200)
Basophils Relative: 0.8 %
Eosinophils Absolute: 230 cells/uL (ref 15–500)
Eosinophils Relative: 2.7 %
HCT: 44.7 % (ref 38.5–50.0)
Hemoglobin: 13.7 g/dL (ref 13.2–17.1)
Lymphs Abs: 1513 cells/uL (ref 850–3900)
MCH: 22.8 pg — ABNORMAL LOW (ref 27.0–33.0)
MCHC: 30.6 g/dL — ABNORMAL LOW (ref 32.0–36.0)
MCV: 74.3 fL — ABNORMAL LOW (ref 80.0–100.0)
MPV: 10 fL (ref 7.5–12.5)
Monocytes Relative: 10.1 %
Neutro Abs: 5831 cells/uL (ref 1500–7800)
Neutrophils Relative %: 68.6 %
Platelets: 283 10*3/uL (ref 140–400)
RBC: 6.02 10*6/uL — ABNORMAL HIGH (ref 4.20–5.80)
RDW: 16.7 % — ABNORMAL HIGH (ref 11.0–15.0)
Total Lymphocyte: 17.8 %
WBC: 8.5 10*3/uL (ref 3.8–10.8)

## 2020-05-03 LAB — TSH: TSH: 0.94 mIU/L (ref 0.40–4.50)

## 2020-05-09 ENCOUNTER — Other Ambulatory Visit: Payer: Self-pay

## 2020-05-09 ENCOUNTER — Ambulatory Visit (INDEPENDENT_AMBULATORY_CARE_PROVIDER_SITE_OTHER): Payer: 59 | Admitting: Internal Medicine

## 2020-05-09 ENCOUNTER — Encounter: Payer: Self-pay | Admitting: Internal Medicine

## 2020-05-09 VITALS — BP 145/88 | HR 73 | Ht 74.0 in | Wt 190.7 lb

## 2020-05-09 DIAGNOSIS — M1710 Unilateral primary osteoarthritis, unspecified knee: Secondary | ICD-10-CM

## 2020-05-09 DIAGNOSIS — R112 Nausea with vomiting, unspecified: Secondary | ICD-10-CM | POA: Diagnosis not present

## 2020-05-09 DIAGNOSIS — R55 Syncope and collapse: Secondary | ICD-10-CM | POA: Diagnosis not present

## 2020-05-09 DIAGNOSIS — S060X1D Concussion with loss of consciousness of 30 minutes or less, subsequent encounter: Secondary | ICD-10-CM | POA: Diagnosis not present

## 2020-05-09 DIAGNOSIS — S060X1A Concussion with loss of consciousness of 30 minutes or less, initial encounter: Secondary | ICD-10-CM | POA: Insufficient documentation

## 2020-05-09 NOTE — Assessment & Plan Note (Signed)
Nausea have decreased.  He does not have any black stool.  He has an appointment with the gastroenterology for colonoscopy later today noted that he has lost some weight before his episode of head concussion

## 2020-05-09 NOTE — Assessment & Plan Note (Signed)
Patient has arthritis of the both knee right knee is more than the left and he was advised to see an orthopedic surgeon.  In the past he refused the knee surgery.  He was advised not to take Advil or Aleve which may be causing his nausea and gastritis.  Rather take Tylenol.  Orthopedic surgeon will give him a cortisone shot to relieve his pain.  Patient was also told not to go back to work for at least 4 weeks till everything got stabilized and consultation and work-up has been complete.

## 2020-05-09 NOTE — Assessment & Plan Note (Signed)
There is no recurrence of syncope.

## 2020-05-09 NOTE — Assessment & Plan Note (Signed)
Patient does not have a recurrence of syncope did not have any recurrence of seizure disorder.  He has an appointment with a neurologist.  CT scan has been scheduled to be done early next week.

## 2020-05-09 NOTE — Progress Notes (Signed)
Established Patient Office Visit  Subjective:  Patient ID: Parker Conway, male    DOB: 1961-05-05  Age: 59 y.o. MRN: [id number]  CC:  Chief Complaint  Patient presents with  . Follow-up    1 week follow up, lab results    HPI  Parker Conway presents for for follow-up on the concussion of the forehead.  He also has a seizure.  And concussion on the forehead he also has laceration which has healed well.  He can walk with pain in the right knee.  Did not have any more black stool.  Patient has lost about 20 pounds of weight.  I sent the patient to gastroenterology for colonoscopy and endoscopy.  He was advised not to take any Advil or naproxen.  For his concussion and headache he was referred to the neurologist.  I do not feel the patient will be able to work for the next 4 weeks.   FMLA was signed for the patient.  History reviewed. No pertinent past medical history.  Past Surgical History:  Procedure Laterality Date  . ANAL FISSURECTOMY      History reviewed. No pertinent family history.  Social History   Socioeconomic History  . Marital status: Married    Spouse name: Not on file  . Number of children: Not on file  . Years of education: Not on file  . Highest education level: Not on file  Occupational History  . Not on file  Tobacco Use  . Smoking status: Current Every Day Smoker    Types: Cigarettes  . Smokeless tobacco: Never Used  Substance and Sexual Activity  . Alcohol use: No  . Drug use: No  . Sexual activity: Not on file  Other Topics Concern  . Not on file  Social History Narrative  . Not on file   Social Determinants of Health   Financial Resource Strain:   . Difficulty of Paying Living Expenses: Not on file  Food Insecurity:   . Worried About Charity fundraiser in the Last Year: Not on file  . Ran Out of Food in the Last Year: Not on file  Transportation Needs:   . Lack of Transportation (Medical): Not on file  . Lack of  Transportation (Non-Medical): Not on file  Physical Activity:   . Days of Exercise per Week: Not on file  . Minutes of Exercise per Session: Not on file  Stress:   . Feeling of Stress : Not on file  Social Connections:   . Frequency of Communication with Friends and Family: Not on file  . Frequency of Social Gatherings with Friends and Family: Not on file  . Attends Religious Services: Not on file  . Active Member of Clubs or Organizations: Not on file  . Attends Archivist Meetings: Not on file  . Marital Status: Not on file  Intimate Partner Violence:   . Fear of Current or Ex-Partner: Not on file  . Emotionally Abused: Not on file  . Physically Abused: Not on file  . Sexually Abused: Not on file    No current outpatient medications on file.   No Known Allergies  ROS Review of Systems  Constitutional: Negative for diaphoresis and fever.  HENT: Negative for congestion, ear discharge, sinus pain and sneezing.   Eyes: Negative.   Respiratory: Negative.   Cardiovascular: Negative.  Negative for chest pain.  Gastrointestinal: Negative.  Negative for abdominal pain, blood in stool, diarrhea and nausea.  Endocrine: Positive  for polyphagia.  Genitourinary: Negative.   Musculoskeletal: Positive for arthralgias, joint swelling, myalgias and neck pain.  Skin: Negative.   Allergic/Immunologic: Negative.   Neurological: Negative.  Negative for tremors, seizures, syncope, speech difficulty, light-headedness and headaches.  Hematological: Negative.   Psychiatric/Behavioral: Negative.  Negative for agitation, behavioral problems, confusion, decreased concentration and hallucinations. The patient is not hyperactive.   All other systems reviewed and are negative.     Objective:    Physical Exam Constitutional:      General: He is not in acute distress.    Appearance: Normal appearance. He is not toxic-appearing.  HENT:     Head: Normocephalic.     Comments: Laceration on  the forehead is healing well.    Mouth/Throat:     Mouth: Mucous membranes are moist.  Eyes:     Pupils: Pupils are equal, round, and reactive to light.  Cardiovascular:     Rate and Rhythm: Normal rate.  Pulmonary:     Effort: Pulmonary effort is normal.  Musculoskeletal:        General: Deformity present.     Comments: Patient has a deformity of the right knee joint due to osteoarthritis he is in considerable pain in the both knees more on the right than the left.  I told him he needs to see orthopedic surgeon for that.  Neurological:     General: No focal deficit present.     Mental Status: He is alert. Mental status is at baseline.  Psychiatric:        Mood and Affect: Mood normal.        Behavior: Behavior normal.        Thought Content: Thought content normal.        Judgment: Judgment normal.     BP (!) 145/88   Pulse 73   Ht 6\' 2"  (1.88 m)   Wt 190 lb 11.2 oz (86.5 kg)   BMI 24.48 kg/m  Wt Readings from Last 3 Encounters:  05/09/20 190 lb 11.2 oz (86.5 kg)  05/02/20 184 lb (83.5 kg)  02/08/20 193 lb 3.2 oz (87.6 kg)     Health Maintenance Due  Topic Date Due  . Hepatitis C Screening  Never done  . COVID-19 Vaccine (1) Never done  . HIV Screening  Never done  . TETANUS/TDAP  Never done  . COLONOSCOPY  Never done  . INFLUENZA VACCINE  Never done    There are no preventive care reminders to display for this patient.  Lab Results  Component Value Date   TSH 0.94 05/02/2020   Lab Results  Component Value Date   WBC 8.5 05/02/2020   HGB 13.7 05/02/2020   HCT 44.7 05/02/2020   MCV 74.3 (L) 05/02/2020   PLT 283 05/02/2020   Lab Results  Component Value Date   NA 142 05/02/2020   K 4.6 05/02/2020   CO2 22 05/02/2020   GLUCOSE 96 05/02/2020   BUN 13 05/02/2020   CREATININE 1.00 05/02/2020   BILITOT 0.3 05/02/2020   AST 11 05/02/2020   ALT 8 (L) 05/02/2020   PROT 5.9 (L) 05/02/2020   CALCIUM 9.1 05/02/2020   ANIONGAP 9 02/04/2020   No results  found for: CHOL No results found for: HDL No results found for: LDLCALC No results found for: TRIG No results found for: CHOLHDL No results found for: HGBA1C    Assessment & Plan:   Problem List Items Addressed This Visit      Cardiovascular and  Mediastinum   Near syncope    Patient does not have a recurrence of syncope did not have any recurrence of seizure disorder.  He has an appointment with a neurologist.  CT scan has been scheduled to be done early next week.        Digestive   Nausea and vomiting - Primary    Nausea have decreased.  He does not have any black stool.  He has an appointment with the gastroenterology for colonoscopy later today noted that he has lost some weight before his episode of head concussion        Nervous and Auditory   Concussion wth loss of consciousness of 30 minutes or less    There is no recurrence of syncope.        Musculoskeletal and Integument   Knee arthropathy    Patient has arthritis of the both knee right knee is more than the left and he was advised to see an orthopedic surgeon.  In the past he refused the knee surgery.  He was advised not to take Advil or Aleve which may be causing his nausea and gastritis.  Rather take Tylenol.  Orthopedic surgeon will give him a cortisone shot to relieve his pain.  Patient was also told not to go back to work for at least 4 weeks till everything got stabilized and consultation and work-up has been complete.         No orders of the defined types were placed in this encounter.   Follow-up: No follow-ups on file.    Cletis Athens, MD

## 2020-05-13 ENCOUNTER — Ambulatory Visit: Admission: RE | Admit: 2020-05-13 | Payer: 59 | Source: Ambulatory Visit

## 2020-05-16 ENCOUNTER — Ambulatory Visit
Admission: RE | Admit: 2020-05-16 | Discharge: 2020-05-16 | Disposition: A | Discharge status: Home / Self Care | Payer: 59 | Source: Ambulatory Visit | Attending: Internal Medicine | Admitting: Internal Medicine

## 2020-05-16 ENCOUNTER — Other Ambulatory Visit: Payer: Self-pay

## 2020-05-16 DIAGNOSIS — R55 Syncope and collapse: Secondary | ICD-10-CM | POA: Insufficient documentation

## 2020-05-16 DIAGNOSIS — M542 Cervicalgia: Secondary | ICD-10-CM | POA: Diagnosis present

## 2020-05-16 MED ORDER — IOHEXOL 300 MG/ML  SOLN
75.0000 mL | Freq: Once | INTRAMUSCULAR | Status: AC | PRN
  Administered 2020-05-16: 75 mL via INTRAVENOUS

## 2020-05-17 ENCOUNTER — Ambulatory Visit: Payer: 59 | Admitting: Internal Medicine

## 2020-05-18 ENCOUNTER — Ambulatory Visit (INDEPENDENT_AMBULATORY_CARE_PROVIDER_SITE_OTHER): Payer: 59 | Admitting: Internal Medicine

## 2020-05-18 ENCOUNTER — Encounter: Payer: Self-pay | Admitting: Internal Medicine

## 2020-05-18 ENCOUNTER — Other Ambulatory Visit: Payer: Self-pay

## 2020-05-18 VITALS — BP 108/61 | HR 69 | Ht 74.0 in | Wt 190.0 lb

## 2020-05-18 DIAGNOSIS — R112 Nausea with vomiting, unspecified: Secondary | ICD-10-CM | POA: Diagnosis not present

## 2020-05-18 DIAGNOSIS — R197 Diarrhea, unspecified: Secondary | ICD-10-CM

## 2020-05-18 DIAGNOSIS — M1710 Unilateral primary osteoarthritis, unspecified knee: Secondary | ICD-10-CM | POA: Diagnosis not present

## 2020-05-18 DIAGNOSIS — Z0000 Encounter for general adult medical examination without abnormal findings: Secondary | ICD-10-CM

## 2020-05-18 DIAGNOSIS — R42 Dizziness and giddiness: Secondary | ICD-10-CM | POA: Insufficient documentation

## 2020-05-18 NOTE — Assessment & Plan Note (Signed)
Diarrhea have decreased on Imodium.  Plan is to have a colonoscopy in the long-run

## 2020-05-18 NOTE — Progress Notes (Signed)
Established Patient Office Visit  Subjective:  Patient ID: Parker Conway, male    DOB: [birth year]  Age: 59 y.o. MRN: [id number]  CC:  Chief Complaint  Patient presents with  . Radiology Results  . Dizziness    2 week follow up for dizziness    HPI  Parker Conway presents for patient has a history of diarrhea ,his C. difficile test results are negative he has also lost about 20 to 22 pounds.  Patient denies any chest pain, he has a personal history of smoking 1 pack/day  He does not drink.  He also has an appointment with a GI for loss of weight.  He was advised not to take any Aleve.  We will get a consultation from the orthopedic for the right knee pain.  His cervical pain is getting better/ Patient complaining of dizziness unsteady gait and headache on the left side also lightheaded.  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  Patient dizziness is constant.  I encouraged him to drink about 6 glasses of water a day because he is tall and his blood pressure is 108/61 on today's visit.  Our plan is to refer him to neurologist and get a lipid check.  The CT scan of the brain was reviewed with the patient and his wife.  Further testing depends on the neurologist. History reviewed. No pertinent past medical history.  Past Surgical History:  Procedure Laterality Date  . ANAL FISSURECTOMY      History reviewed. No pertinent family history.  Social History   Socioeconomic History  . Marital status: Married    Spouse name: Not on file  . Number of children: Not on file  . Years of education: Not on file  . Highest education level: Not on file  Occupational History  . Not on file  Tobacco Use  . Smoking status: Current Every Day Smoker    Types: Cigarettes  . Smokeless tobacco: Never Used  Substance and Sexual Activity  . Alcohol use: No  . Drug use: No  . Sexual activity: Not on file  Other Topics Concern  . Not on file  Social History  Narrative  . Not on file   Social Determinants of Health   Financial Resource Strain:   . Difficulty of Paying Living Expenses: Not on file  Food Insecurity:   . Worried About Charity fundraiser in the Last Year: Not on file  . Ran Out of Food in the Last Year: Not on file  Transportation Needs:   . Lack of Transportation (Medical): Not on file  . Lack of Transportation (Non-Medical): Not on file  Physical Activity:   . Days of Exercise per Week: Not on file  . Minutes of Exercise per Session: Not on file  Stress:   . Feeling of Stress : Not on file  Social Connections:   . Frequency of Communication with Friends and Family: Not on file  . Frequency of Social Gatherings with Friends and Family: Not on file  . Attends Religious Services: Not on file  . Active Member of Clubs or Organizations: Not on file  . Attends Archivist Meetings: Not on file  . Marital Status: Not on file  Intimate Partner Violence:   . Fear of Current or Ex-Partner: Not on file  . Emotionally Abused: Not on file  . Physically Abused: Not on file  . Sexually Abused: Not on file  No current outpatient medications on file.   No Known Allergies  ROS Review of Systems  Constitutional: Positive for fatigue. Negative for diaphoresis.  HENT: Negative for congestion, ear pain, nosebleeds, sinus pressure and sore throat.   Respiratory: Negative for cough and choking.   Endocrine: Negative for polydipsia.  Genitourinary: Negative for hematuria.  Musculoskeletal: Positive for gait problem and neck stiffness. Negative for arthralgias.  Neurological: Positive for dizziness, syncope, weakness and headaches. Negative for tremors, seizures and speech difficulty.  Hematological: Negative for adenopathy.  Psychiatric/Behavioral: Negative for behavioral problems and self-injury. The patient is not nervous/anxious.       Objective:    Physical Exam Vitals reviewed.  Constitutional:       Appearance: Normal appearance.  HENT:     Mouth/Throat:     Mouth: Mucous membranes are moist.  Eyes:     Pupils: Pupils are equal, round, and reactive to light.  Neck:     Vascular: No carotid bruit.  Cardiovascular:     Rate and Rhythm: Normal rate and regular rhythm.     Pulses: Normal pulses.     Heart sounds: Normal heart sounds.  Pulmonary:     Effort: Pulmonary effort is normal.     Breath sounds: Normal breath sounds.  Abdominal:     General: Bowel sounds are normal.     Palpations: Abdomen is soft. There is no hepatomegaly, splenomegaly or mass.     Tenderness: There is no abdominal tenderness.     Hernia: No hernia is present.  Musculoskeletal:        General: No swelling.     Cervical back: Neck supple.     Right lower leg: No edema.     Left lower leg: No edema.     Comments: Patient has arthritis of the right knee his gait is slightly unsteady and he limps on walking.  Patient will be referred to orthopedic surgeon for evaluation of right knee.  Skin:    General: Skin is warm.     Coloration: Skin is not jaundiced.     Findings: No bruising, erythema, lesion or rash.  Neurological:     General: No focal deficit present.     Mental Status: He is alert and oriented to person, place, and time.     Cranial Nerves: No cranial nerve deficit.     Motor: No weakness.     Comments: CT scan of the brain does not show any new lesion.  There is no hemorrhage or stroke or mass lesion.  But he continues to complain of dizziness so he will be referred to the neurologist for further work-up and treatment  Psychiatric:        Mood and Affect: Mood normal.        Behavior: Behavior normal.        Thought Content: Thought content normal.     BP 108/61   Pulse 69   Ht 6\' 2"  (1.88 m)   Wt 190 lb (86.2 kg)   BMI 24.39 kg/m  Wt Readings from Last 3 Encounters:  05/18/20 190 lb (86.2 kg)  05/09/20 190 lb 11.2 oz (86.5 kg)  05/02/20 184 lb (83.5 kg)     Health Maintenance  Due  Topic Date Due  . Hepatitis C Screening  Never done  . COVID-19 Vaccine (1) Never done  . HIV Screening  Never done  . TETANUS/TDAP  Never done  . COLONOSCOPY  Never done  . INFLUENZA VACCINE  Never  done    There are no preventive care reminders to display for this patient.  Lab Results  Component Value Date   TSH 0.94 05/02/2020   Lab Results  Component Value Date   WBC 8.5 05/02/2020   HGB 13.7 05/02/2020   HCT 44.7 05/02/2020   MCV 74.3 (L) 05/02/2020   PLT 283 05/02/2020   Lab Results  Component Value Date   NA 142 05/02/2020   K 4.6 05/02/2020   CO2 22 05/02/2020   GLUCOSE 96 05/02/2020   BUN 13 05/02/2020   CREATININE 1.00 05/02/2020   BILITOT 0.3 05/02/2020   AST 11 05/02/2020   ALT 8 (L) 05/02/2020   PROT 5.9 (L) 05/02/2020   CALCIUM 9.1 05/02/2020   ANIONGAP 9 02/04/2020   No results found for: CHOL No results found for: HDL No results found for: LDLCALC No results found for: TRIG No results found for: CHOLHDL No results found for: HGBA1C    Assessment & Plan:   Problem List Items Addressed This Visit      Digestive   Nausea and vomiting    Patient nausea and vomiting have decreased.  He continued to have diarrhea but it is controlled with Imodium.  Patient will have appointment with GI for colonoscopy at that time they might consider to do an endoscopy to further evaluate weight loss and gastritis.        Musculoskeletal and Integument   Knee arthropathy - Primary    Patient has a chronic osteoarthritis of the right knee joint.  It affects him more on the right than the left.  I will send him to the orthopedic surgeon, he might require a knee replacement in the long run.        Other   Preventative health care    Patient main problem at the present time.  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  His CT scan does not show any lesions or stroke.  His blood pressure is low so I told him to drink a lot of  fluid.  He was referred to neurologist and ENT specialist to evaluate the dizziness.      Diarrhea    Diarrhea have decreased on Imodium.  Plan is to have a colonoscopy in the long-run      Dizziness    Dizziness is the main problem of the present time will refer to neurologist for further evaluation as well ENT specialist.  CT findings were explained to the patient         No orders of the defined types were placed in this encounter.   Follow-up: No follow-ups on file.    Cletis Athens, MD

## 2020-05-18 NOTE — Assessment & Plan Note (Signed)
Patient has a chronic osteoarthritis of the right knee joint.  It affects him more on the right than the left.  I will send him to the orthopedic surgeon, he might require a knee replacement in the long run.

## 2020-05-18 NOTE — Assessment & Plan Note (Signed)
Patient main problem at the present time.  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  His CT scan does not show any lesions or stroke.  His blood pressure is low so I told him to drink a lot of fluid.  He was referred to neurologist and ENT specialist to evaluate the dizziness.

## 2020-05-18 NOTE — Assessment & Plan Note (Signed)
Dizziness is the main problem of the present time will refer to neurologist for further evaluation as well ENT specialist.  CT findings were explained to the patient

## 2020-05-18 NOTE — Assessment & Plan Note (Signed)
Patient nausea and vomiting have decreased.  He continued to have diarrhea but it is controlled with Imodium.  Patient will have appointment with GI for colonoscopy at that time they might consider to do an endoscopy to further evaluate weight loss and gastritis.

## 2020-05-20 NOTE — Addendum Note (Signed)
Addended by: Alois Cliche on: 05/20/2020 11:34 AM   Modules accepted: Orders

## 2020-05-25 ENCOUNTER — Ambulatory Visit: Payer: 59 | Admitting: Internal Medicine

## 2020-05-30 ENCOUNTER — Ambulatory Visit: Payer: 59 | Admitting: Internal Medicine

## 2020-06-07 ENCOUNTER — Encounter: Payer: Self-pay | Admitting: Internal Medicine

## 2020-06-07 ENCOUNTER — Ambulatory Visit: Payer: 59

## 2020-06-07 ENCOUNTER — Encounter: Payer: Self-pay | Admitting: Orthopedic Surgery

## 2020-06-07 ENCOUNTER — Ambulatory Visit (INDEPENDENT_AMBULATORY_CARE_PROVIDER_SITE_OTHER): Payer: 59 | Admitting: Internal Medicine

## 2020-06-07 ENCOUNTER — Ambulatory Visit (INDEPENDENT_AMBULATORY_CARE_PROVIDER_SITE_OTHER): Payer: 59 | Admitting: Orthopedic Surgery

## 2020-06-07 ENCOUNTER — Other Ambulatory Visit: Payer: Self-pay

## 2020-06-07 VITALS — BP 121/77 | HR 61 | Ht 74.0 in | Wt 186.0 lb

## 2020-06-07 VITALS — BP 179/92 | HR 66 | Ht 74.0 in | Wt 187.0 lb

## 2020-06-07 DIAGNOSIS — M25519 Pain in unspecified shoulder: Secondary | ICD-10-CM | POA: Insufficient documentation

## 2020-06-07 DIAGNOSIS — M7582 Other shoulder lesions, left shoulder: Secondary | ICD-10-CM | POA: Diagnosis not present

## 2020-06-07 DIAGNOSIS — M25562 Pain in left knee: Secondary | ICD-10-CM | POA: Diagnosis not present

## 2020-06-07 DIAGNOSIS — M1712 Unilateral primary osteoarthritis, left knee: Secondary | ICD-10-CM | POA: Diagnosis not present

## 2020-06-07 DIAGNOSIS — M1710 Unilateral primary osteoarthritis, unspecified knee: Secondary | ICD-10-CM

## 2020-06-07 DIAGNOSIS — G8929 Other chronic pain: Secondary | ICD-10-CM | POA: Diagnosis not present

## 2020-06-07 DIAGNOSIS — I10 Essential (primary) hypertension: Secondary | ICD-10-CM

## 2020-06-07 DIAGNOSIS — M1711 Unilateral primary osteoarthritis, right knee: Secondary | ICD-10-CM

## 2020-06-07 DIAGNOSIS — R634 Abnormal weight loss: Secondary | ICD-10-CM

## 2020-06-07 DIAGNOSIS — R42 Dizziness and giddiness: Secondary | ICD-10-CM

## 2020-06-07 DIAGNOSIS — M25561 Pain in right knee: Secondary | ICD-10-CM | POA: Diagnosis not present

## 2020-06-07 DIAGNOSIS — M12812 Other specific arthropathies, not elsewhere classified, left shoulder: Secondary | ICD-10-CM

## 2020-06-07 MED ORDER — KETOROLAC TROMETHAMINE 60 MG/2ML IM SOLN
60.0000 mg | Freq: Once | INTRAMUSCULAR | Status: AC
  Administered 2020-06-07: 60 mg via INTRAMUSCULAR

## 2020-06-07 NOTE — Progress Notes (Signed)
Established Patient Office Visit  Subjective:  Patient ID: Parker Conway, male    DOB: Apr 16, 1961  Age: 59 y.o. MRN: [id number]  CC:  Chief Complaint  Patient presents with  . Nausea  . Pain    HPI  Parker Conway presents for pain in lt shoulder  History reviewed. No pertinent past medical history.  Past Surgical History:  Procedure Laterality Date  . ANAL FISSURECTOMY      History reviewed. No pertinent family history.  Social History   Socioeconomic History  . Marital status: Married    Spouse name: Not on file  . Number of children: Not on file  . Years of education: Not on file  . Highest education level: Not on file  Occupational History  . Not on file  Tobacco Use  . Smoking status: Current Every Day Smoker    Types: Cigarettes  . Smokeless tobacco: Never Used  Substance and Sexual Activity  . Alcohol use: No  . Drug use: No  . Sexual activity: Not on file  Other Topics Concern  . Not on file  Social History Narrative  . Not on file   Social Determinants of Health   Financial Resource Strain:   . Difficulty of Paying Living Expenses: Not on file  Food Insecurity:   . Worried About Charity fundraiser in the Last Year: Not on file  . Ran Out of Food in the Last Year: Not on file  Transportation Needs:   . Lack of Transportation (Medical): Not on file  . Lack of Transportation (Non-Medical): Not on file  Physical Activity:   . Days of Exercise per Week: Not on file  . Minutes of Exercise per Session: Not on file  Stress:   . Feeling of Stress : Not on file  Social Connections:   . Frequency of Communication with Friends and Family: Not on file  . Frequency of Social Gatherings with Friends and Family: Not on file  . Attends Religious Services: Not on file  . Active Member of Clubs or Organizations: Not on file  . Attends Archivist Meetings: Not on file  . Marital Status: Not on file  Intimate Partner Violence:   . Fear of  Current or Ex-Partner: Not on file  . Emotionally Abused: Not on file  . Physically Abused: Not on file  . Sexually Abused: Not on file     Current Outpatient Medications:  .  naproxen sodium (ALEVE) 220 MG tablet, Take 220 mg by mouth as needed., Disp: , Rfl:   Current Facility-Administered Medications:  .  ketorolac (TORADOL) injection 60 mg, 60 mg, Intramuscular, Once, Verdell Kincannon, Viann Shove, MD   No Known Allergies  ROS Review of Systems  Constitutional: Negative.   HENT: Positive for rhinorrhea.   Eyes: Negative for discharge.  Respiratory: Negative for cough, choking and chest tightness.   Cardiovascular: Negative for chest pain.  Gastrointestinal: Negative for abdominal pain and anal bleeding.  Genitourinary: Negative for dysuria.  Musculoskeletal: Negative for arthralgias.  Neurological: Positive for dizziness. Negative for speech difficulty.  Psychiatric/Behavioral: Negative for confusion.      Objective:    Physical Exam Vitals reviewed.  Constitutional:      Appearance: Normal appearance.  HENT:     Mouth/Throat:     Mouth: Mucous membranes are moist.  Eyes:     Pupils: Pupils are equal, round, and reactive to light.  Neck:     Vascular: No carotid bruit.  Cardiovascular:  Rate and Rhythm: Normal rate and regular rhythm.     Pulses: Normal pulses.     Heart sounds: Normal heart sounds.  Pulmonary:     Effort: Pulmonary effort is normal.     Breath sounds: Normal breath sounds.  Abdominal:     General: Bowel sounds are normal.     Palpations: Abdomen is soft. There is no hepatomegaly, splenomegaly or mass.     Tenderness: There is no abdominal tenderness.     Hernia: No hernia is present.  Musculoskeletal:     Cervical back: Neck supple.     Right lower leg: No edema.     Left lower leg: No edema.     Comments: Patient has arthritis of both shoulder left more than the right.  Left shoulder is very painful there is no history of any infection.  He already  received a cortisone shot today by the orthopedic surgeon.  He also has a significant arthritis of the right knee.  And has to be followed up by the orthopedic for that to.  He also has an appointment with GI specialist.  Skin:    Findings: No rash.  Neurological:     Mental Status: He is alert and oriented to person, place, and time.     Motor: No weakness.  Psychiatric:        Mood and Affect: Mood normal.        Behavior: Behavior normal.     BP (!) 179/92   Pulse 66   Ht 6\' 2"  (1.88 m)   Wt 187 lb (84.8 kg)   BMI 24.01 kg/m  Wt Readings from Last 3 Encounters:  06/07/20 187 lb (84.8 kg)  06/07/20 186 lb (84.4 kg)  05/18/20 190 lb (86.2 kg)     Health Maintenance Due  Topic Date Due  . Hepatitis C Screening  Never done  . COVID-19 Vaccine (1) Never done  . HIV Screening  Never done  . TETANUS/TDAP  Never done  . COLONOSCOPY  Never done  . INFLUENZA VACCINE  Never done    There are no preventive care reminders to display for this patient.  Lab Results  Component Value Date   TSH 0.94 05/02/2020   Lab Results  Component Value Date   WBC 8.5 05/02/2020   HGB 13.7 05/02/2020   HCT 44.7 05/02/2020   MCV 74.3 (L) 05/02/2020   PLT 283 05/02/2020   Lab Results  Component Value Date   NA 142 05/02/2020   K 4.6 05/02/2020   CO2 22 05/02/2020   GLUCOSE 96 05/02/2020   BUN 13 05/02/2020   CREATININE 1.00 05/02/2020   BILITOT 0.3 05/02/2020   AST 11 05/02/2020   ALT 8 (L) 05/02/2020   PROT 5.9 (L) 05/02/2020   CALCIUM 9.1 05/02/2020   ANIONGAP 9 02/04/2020   No results found for: CHOL No results found for: HDL No results found for: LDLCALC No results found for: TRIG No results found for: CHOLHDL No results found for: HGBA1C    Assessment & Plan:   Problem List Items Addressed This Visit      Musculoskeletal and Integument   Knee arthropathy    Patient has some arthropathy of the both knee right more than the left was referred to the orthopedics       Rotator cuff arthropathy of left shoulder    Patient has a rotator cuff syndrome on the left shoulder and right shoulder.  He was seen by orthopedic and  was given a shot on the left shoulder by a posterior approach.  Patient says that his pain is getting worse.  I gave him Toradol 30 mg intramuscular and advised him to see the orthopedic surgeon again.        Other   Dizziness    Patient has intermittent dizziness which is due to weakness and due to allergies so he was advised to take antiallergic medication like Claritin 5 mg p.o. daily.      Shoulder pain - Primary    Refer to Ortho.      Relevant Medications   ketorolac (TORADOL) injection 60 mg   Weight loss    Patient has a significant weight loss recently so he was referred to GI clinic for evaluation       Other Visit Diagnoses    Essential hypertension          Meds ordered this encounter  Medications  . ketorolac (TORADOL) injection 60 mg    Follow-up: No follow-ups on file.    Cletis Athens, MD

## 2020-06-07 NOTE — Assessment & Plan Note (Signed)
Patient has some arthropathy of the both knee right more than the left was referred to the orthopedics

## 2020-06-07 NOTE — Assessment & Plan Note (Signed)
Patient has a significant weight loss recently so he was referred to GI clinic for evaluation

## 2020-06-07 NOTE — Assessment & Plan Note (Signed)
Refer to Ortho?

## 2020-06-07 NOTE — Assessment & Plan Note (Signed)
Patient has a rotator cuff syndrome on the left shoulder and right shoulder.  He was seen by orthopedic and was given a shot on the left shoulder by a posterior approach.  Patient says that his pain is getting worse.  I gave him Toradol 30 mg intramuscular and advised him to see the orthopedic surgeon again.

## 2020-06-07 NOTE — Progress Notes (Signed)
New Patient Visit  Assessment: Parker Conway is a 59 y.o. male with the following: 1.  Severe bilateral knee arthritis, with progressive varus deformity, right worse than the left 2.  Left rotator cuff tendinitis  Plan: I had an extensive discussion with [name] in clinic today in the presence of his wife regarding his bilateral knee pain.  We reviewed the x-rays which demonstrates severe, bone-on-bone arthritis bilaterally.  He has a progressive varus deformity on the right, and a mild deformity on the left.  His symptoms have been ongoing and progressive for several years.  Is now affecting his everyday life, including his sleep.  Steroid injections have helped in the past, with diminishing effect.  At this point in time, I feel he is a good candidate for knee replacement surgery, and he would do well in his recovery.  As a result, I will discuss his care with Dr. Aline Brochure who will hopefully be able to schedule a follow-up appointment with the patient to discuss options.  Regarding his left shoulder, he has excellent strength and range of motion.  He does have some discomfort with testing, and the pain is primarily in the posterior lateral aspect of the shoulder.  He has had 1 injection in the past which provided excellent relief for greater than 1 year.  I offered him a repeat injection today, and he has elected to proceed.  If he continues to have issues with his left shoulder, I am happy to see him again.  Depending on how well the cortisone improves his symptoms, we may have to consider obtaining an MRI to further evaluate the integrity of his rotator cuff.  All questions were answered they are amenable to this plan.  Procedure note injection Left shoulder    Verbal consent was obtained to inject the left shoulder, subacromial space Timeout was completed to confirm the site of injection.  The skin was prepped with alcohol and ethyl chloride was sprayed at the injection site.  A  21-gauge needle was used to inject 40 mg of Depo-Medrol and 1% lidocaine (3 cc) into the subacromial space of the left shoulder using a posterolateral approach.  There were no complications. A sterile bandage was applied.   Follow-up: Return if symptoms worsen or fail to improve; left shoulder, for f/u Dr. Aline Brochure for B knees.  Subjective:  Chief Complaint  Patient presents with  . Knee Pain    bilateral knee pain for years, no injury or falls    History of Present Illness: Parker Conway is a 59 y.o. male who has been referred to clinic today by Cletis Athens, MD for bilateral knee pain.  He has had severe, progressive worsening of pain in bilateral knees over several years.  He has had multiple injections in his knees which did provide some relief, with diminishing effect.  Both the patient, and his wife, who is present in clinic today, have noticed deformity of the right knee, including more severe bowing of the right knee.  His pain is worse at night.  He states it is keeping him up at night.  He is unable to maintain a comfortable position while laying in bed.  Of note, he is also been dealing with some medical problems.  He is currently on disability from his work.  He has had a couple of fainting incidents which are currently being worked up.  Dr. Lavera Guise is currently evaluating him with no active diagnosis.  He has also been having some  issues of the left shoulder.  Approximately 1-1/2 to 2 years ago, he states he slipped and fell and landed directly on his left shoulder.  This was followed by some pain and discomfort with associated disability.  He has received 1 steroid injection which provided excellent relief of his symptoms for over a year.  He is not currently taking medications on a consistent basis.  Review of Systems: No numbness or tingling No chest pain No shortness of breath No fevers or chills  Medical History:  No past medical history on file.  Past Surgical  History:  Procedure Laterality Date  . ANAL FISSURECTOMY      No family history on file. Social History   Tobacco Use  . Smoking status: Current Every Day Smoker    Types: Cigarettes  . Smokeless tobacco: Never Used  Substance Use Topics  . Alcohol use: No  . Drug use: No    No Known Allergies  Current Meds  Medication Sig  . naproxen sodium (ALEVE) 220 MG tablet Take 220 mg by mouth as needed.    Objective: BP 121/77   Pulse 61   Ht 6\' 2"  (1.88 m)   Wt 186 lb (84.4 kg)   BMI 23.88 kg/m   Physical Exam:  General:  Alert and oriented, no acute distress Gait:  Right sided antalgic gait.  Evaluation of the left shoulder demonstrates no deformity.  Mild atrophy is appreciated in the posterior aspect of the scapula.  He has near full range of motion of the left shoulder compared to the right.  He does demonstrate some pain with range of motion and strength testing.  150 degrees forward elevation.  45 degrees external rotation at the side.  Internal rotation to T12.  Negative belly press.  5/5 strength in the supraspinatus as well as the infraspinatus.  Negative drop arm testing.  Evaluation of bilateral knees demonstrates a severe varus deformity of the right knee with a mild varus deformity of the left knee.  Range of motion from just short of full extension to 120 degrees with minimal discomfort.  Range of motion on the left from 0-120 degrees.  Tenderness to palpation along the medial joint line bilaterally.  Sensation is intact distally.  2+ DP pulses.    IMAGING: I personally ordered and reviewed the following images:  X-ray of the right knee demonstrates varus alignment.  Severe medial compartment arthritis with complete loss of joint space, with bone-on-bone articulation, subchondral sclerosis and associated osteophytes.  Impression: Severe right knee arthritis with varus deformity.  X-ray of the left knee demonstrates a mild varus alignment.  Severe medial  compartment arthritis with complete loss of joint space, bone-on-bone articulation and subchondral sclerosis.  Impression: Severe left knee arthritis with varus deformity   New Medications:  No orders of the defined types were placed in this encounter.     Mordecai Rasmussen, MD  06/07/2020 11:41 AM

## 2020-06-07 NOTE — Assessment & Plan Note (Signed)
Patient has intermittent dizziness which is due to weakness and due to allergies so he was advised to take antiallergic medication like Claritin 5 mg p.o. daily.

## 2020-06-13 ENCOUNTER — Other Ambulatory Visit: Payer: Self-pay | Admitting: *Deleted

## 2020-06-13 MED ORDER — DIMENHYDRINATE 50 MG PO TABS: 25.0000 mg | ORAL_TABLET | Freq: Three times a day (TID) | ORAL | 0 refills | Status: DC | PRN

## 2020-06-23 ENCOUNTER — Encounter: Payer: Self-pay | Admitting: Family Medicine

## 2020-06-23 ENCOUNTER — Other Ambulatory Visit: Payer: Self-pay

## 2020-06-23 ENCOUNTER — Ambulatory Visit (INDEPENDENT_AMBULATORY_CARE_PROVIDER_SITE_OTHER): Payer: 59 | Admitting: Family Medicine

## 2020-06-23 VITALS — BP 116/81 | HR 69 | Ht 74.0 in | Wt 186.8 lb

## 2020-06-23 DIAGNOSIS — R42 Dizziness and giddiness: Secondary | ICD-10-CM

## 2020-06-23 NOTE — Assessment & Plan Note (Addendum)
Here to have FMLA papers filled out again for renewal this month. He continues to see the specialty ref, Neurology, GI. Papers filled out and given back to patient.

## 2020-06-23 NOTE — Progress Notes (Signed)
Established Patient Office Visit  SUBJECTIVE:  Subjective  Patient ID: Parker Conway, male    DOB: 02-19-1961  Age: 59 y.o. MRN: [id number]  CC:  Chief Complaint  Patient presents with  . Follow-up    Patient is here today for follow up of nausea, weight loss and double vision.    HPI Parker Conway is a 59 y.o. male presenting today for Needs FMLA papers renewed    No past medical history on file.  Past Surgical History:  Procedure Laterality Date  . ANAL FISSURECTOMY      No family history on file.  Social History   Socioeconomic History  . Marital status: Married    Spouse name: Not on file  . Number of children: Not on file  . Years of education: Not on file  . Highest education level: Not on file  Occupational History  . Not on file  Tobacco Use  . Smoking status: Current Every Day Smoker    Types: Cigarettes  . Smokeless tobacco: Never Used  Substance and Sexual Activity  . Alcohol use: No  . Drug use: No  . Sexual activity: Not on file  Other Topics Concern  . Not on file  Social History Narrative  . Not on file   Social Determinants of Health   Financial Resource Strain:   . Difficulty of Paying Living Expenses: Not on file  Food Insecurity:   . Worried About Charity fundraiser in the Last Year: Not on file  . Ran Out of Food in the Last Year: Not on file  Transportation Needs:   . Lack of Transportation (Medical): Not on file  . Lack of Transportation (Non-Medical): Not on file  Physical Activity:   . Days of Exercise per Week: Not on file  . Minutes of Exercise per Session: Not on file  Stress:   . Feeling of Stress : Not on file  Social Connections:   . Frequency of Communication with Friends and Family: Not on file  . Frequency of Social Gatherings with Friends and Family: Not on file  . Attends Religious Services: Not on file  . Active Member of Clubs or Organizations: Not on file  . Attends Archivist Meetings:  Not on file  . Marital Status: Not on file  Intimate Partner Violence:   . Fear of Current or Ex-Partner: Not on file  . Emotionally Abused: Not on file  . Physically Abused: Not on file  . Sexually Abused: Not on file     Current Outpatient Medications:  .  dimenhyDRINATE (DRAMAMINE) 50 MG tablet, Take 0.5 tablets (25 mg total) by mouth every 8 (eight) hours as needed., Disp: 30 tablet, Rfl: 0 .  naproxen sodium (ALEVE) 220 MG tablet, Take 220 mg by mouth as needed., Disp: , Rfl:    No Known Allergies  ROS Review of Systems  Constitutional: Negative.   HENT: Negative.   Respiratory: Negative.   Cardiovascular: Negative.   Genitourinary: Negative.   Musculoskeletal: Negative.   Neurological: Positive for dizziness.  Psychiatric/Behavioral: Negative.      OBJECTIVE:    Physical Exam  BP 116/81   Pulse 69   Ht 6\' 2"  (1.88 m)   Wt 186 lb 12.8 oz (84.7 kg)   BMI 23.98 kg/m  Wt Readings from Last 3 Encounters:  06/23/20 186 lb 12.8 oz (84.7 kg)  06/07/20 187 lb (84.8 kg)  06/07/20 186 lb (84.4 kg)    Health Maintenance  Due  Topic Date Due  . Hepatitis C Screening  Never done  . COVID-19 Vaccine (1) Never done  . HIV Screening  Never done  . TETANUS/TDAP  Never done  . COLONOSCOPY  Never done  . INFLUENZA VACCINE  Never done    There are no preventive care reminders to display for this patient.  CBC Latest Ref Rng & Units 05/02/2020  WBC 3.8 - 10.8 Thousand/uL 8.5  Hemoglobin 13.2 - 17.1 g/dL 13.7  Hematocrit 38 - 50 % 44.7  Platelets 140 - 400 Thousand/uL 283   CMP Latest Ref Rng & Units 05/02/2020 02/04/2020  Glucose 65 - 99 mg/dL 96 88  BUN 7 - 25 mg/dL 13 10  Creatinine 0.70 - 1.33 mg/dL 1.00 0.90  Sodium 135 - 146 mmol/L 142 141  Potassium 3.5 - 5.3 mmol/L 4.6 4.4  Chloride 98 - 110 mmol/L 108 106  CO2 20 - 32 mmol/L 22 26  Calcium 8.6 - 10.3 mg/dL 9.1 8.9  Total Protein 6.1 - 8.1 g/dL 5.9(L) -  Total Bilirubin 0.2 - 1.2 mg/dL 0.3 -  AST 10 - 35  U/L 11 -  ALT 9 - 46 U/L 8(L) -    Lab Results  Component Value Date   TSH 0.94 05/02/2020   Lab Results  Component Value Date   ANIONGAP 9 02/04/2020   No results found for: CHOL, HDL, LDLCALC, CHOLHDL No results found for: TRIG No results found for: HGBA1C    ASSESSMENT & PLAN:   Problem List Items Addressed This Visit      Other   Dizziness - Primary    Here to have FMLA papers filled out again for renewal this month. He continues to see the specialty ref, Neurology, GI. Papers filled out and given back to patient.          No orders of the defined types were placed in this encounter.     Follow-up: No follow-ups on file.    Beckie Salts, [place] [address] [place], [place] [address]

## 2020-06-27 ENCOUNTER — Ambulatory Visit (INDEPENDENT_AMBULATORY_CARE_PROVIDER_SITE_OTHER): Payer: 59 | Admitting: Gastroenterology

## 2020-06-27 ENCOUNTER — Encounter: Payer: Self-pay | Admitting: Gastroenterology

## 2020-06-27 ENCOUNTER — Other Ambulatory Visit: Payer: Self-pay

## 2020-06-27 VITALS — BP 124/70 | HR 70 | Temp 98.2°F | Ht 74.0 in | Wt 187.2 lb

## 2020-06-27 DIAGNOSIS — R1013 Epigastric pain: Secondary | ICD-10-CM

## 2020-06-27 DIAGNOSIS — R197 Diarrhea, unspecified: Secondary | ICD-10-CM

## 2020-06-27 DIAGNOSIS — R634 Abnormal weight loss: Secondary | ICD-10-CM

## 2020-06-27 DIAGNOSIS — G8929 Other chronic pain: Secondary | ICD-10-CM | POA: Diagnosis not present

## 2020-06-27 MED ORDER — NA SULFATE-K SULFATE-MG SULF 17.5-3.13-1.6 GM/177ML PO SOLN: 354.0000 mL | Freq: Once | ORAL | 0 refills | Status: AC

## 2020-06-27 NOTE — Progress Notes (Signed)
Gastroenterology Consultation  Referring Provider:     Cletis Athens, MD Primary Care Physician:  Cletis Athens, MD Primary Gastroenterologist:  Dr. Allen Norris     Reason for Consultation:     Weight loss and diarrhea        HPI:   Parker Conway is a 59 y.o. y/o male referred for consultation & management of Weight loss and diarrhea by Dr. Cletis Athens, MD.  This patient comes in today after being seen by his primary care provider with a history of a 25 pound weight loss and diarrhea.  The patient has been taking Imodium for his diarrhea. His last colonoscopy was 8 years ago. The patient reports that he has had a decreased appetite and only eats 1 meal a day.  The diarrhea has woken him up from sleep but that does not happen often.  His wife does report that the stool is very foul-smelling.  The patient has had some chest pain which she attributed to reflux and took multiple Tums and his wife's omeprazole with final resolution of the symptoms over time.  The patient does report that he had a colonoscopy 8 years ago and at that time he had polyps and was recommended to have a repeat colonoscopy in 3 years.  The patient did not follow-up with those recommendations.  There is no report of any history of alcohol abuse.  The patient denies any black stools or bloody stools although he states that he had some GI bleeding before his last colonoscopy but has had none since.  History reviewed. No pertinent past medical history.  Past Surgical History:  Procedure Laterality Date  . ANAL FISSURECTOMY      Prior to Admission medications   Medication Sig Start Date End Date Taking? Authorizing Provider  dimenhyDRINATE (DRAMAMINE) 50 MG tablet Take 0.5 tablets (25 mg total) by mouth every 8 (eight) hours as needed. 06/13/20   Cletis Athens, MD  naproxen sodium (ALEVE) 220 MG tablet Take 220 mg by mouth as needed.    [provider]    History reviewed. No pertinent family history.   Social  History   Tobacco Use  . Smoking status: Current Every Day Smoker    Types: Cigarettes  . Smokeless tobacco: Never Used  Substance Use Topics  . Alcohol use: Yes    Alcohol/week: 7.0 standard drinks    Types: 7 Cans of beer per week  . Drug use: No    Allergies as of 06/27/2020  . (No Known Allergies)    Review of Systems:    All systems reviewed and negative except where noted in HPI.   Physical Exam:  BP 124/70 (BP Location: Left Arm, Patient Position: Sitting, Cuff Size: Normal)   Pulse 70   Temp 98.2 F (36.8 C) (Oral)   Ht 6\' 2"  (1.88 m)   Wt 187 lb 4 oz (84.9 kg)   BMI 24.04 kg/m  No LMP for male patient. General:   Alert,  Well-developed, well-nourished, pleasant and cooperative in NAD Head:  Normocephalic and atraumatic. Eyes:  Sclera clear, no icterus.   Conjunctiva pink. Ears:  Normal auditory acuity. Neck:  Supple; no masses or thyromegaly. Lungs:  Respirations even and unlabored.  Clear throughout to auscultation.   No wheezes, crackles, or rhonchi. No acute distress. Heart:  Regular rate and rhythm; no murmurs, clicks, rubs, or gallops. Abdomen:  Normal bowel sounds.  No bruits.  Soft, non-tender and non-distended without masses, hepatosplenomegaly or hernias noted.  No guarding or rebound tenderness.  Negative Carnett sign.   Rectal:  Deferred.  Pulses:  Normal pulses noted. Extremities:  No clubbing or edema.  No cyanosis. Neurologic:  Alert and oriented x3;  grossly normal neurologically. Skin:  Intact without significant lesions or rashes.  No jaundice. Lymph Nodes:  No significant cervical adenopathy. Psych:  Alert and cooperative. Normal mood and affect.  Imaging Studies: DG Knee Complete 4 Views Left  Result Date: 06/07/2020 X-ray of the left knee demonstrates a mild varus alignment.  Severe medial compartment arthritis with complete loss of joint space, bone-on-bone articulation and subchondral sclerosis. Impression: Severe left knee arthritis  with varus deformity  DG Knee Complete 4 Views Right  Result Date: 06/07/2020 X-ray of the right knee demonstrates varus alignment.  Severe medial compartment arthritis with complete loss of joint space, with bone-on-bone articulation, subchondral sclerosis and associated osteophytes. Impression: Severe right knee arthritis with varus deformity.   Assessment and Plan:   Parker Conway is a 59 y.o. y/o male who comes in today with multiple medical issues including diarrhea, weight loss and epigastric pain.  The patient will be set up for an EGD and colonoscopy to rule out any GI pathology as the cause of his symptoms.  The patient will also be set up for a CT scan of the abdomen and pelvis due to his dramatic weight loss over the last 2-1/2 months.  The patient has been told that if these do not delineate the cause of his symptoms that he may need testing of his pancreas to make sure he does not have pancreatic insufficiency versus bacterial overgrowth as the cause of his weight loss and malabsorption.  The patient has been told to start a multivitamin since he has been having some neurological issues that may be vitamin deficiency related.  The patient has also been given samples of Dexilant for his epigastric pain which may be caused by reflux.  The patient and his wife have been explained the plan and agree with it.    Lucilla Lame, MD. [facility]    Note: This dictation was prepared with Dragon dictation along with smaller phrase technology. Any transcriptional errors that result from this process are unintentional.

## 2020-06-27 NOTE — Patient Instructions (Signed)
You are scheduled for CT scan on 07/01/2020 [place] [facility]. Nothing to eat or drink for 4 hours before and pick up prep kit before

## 2020-06-27 NOTE — H&P (View-Only) (Signed)
Gastroenterology Consultation  Referring Provider:     Cletis Athens, MD Primary Care Physician:  Cletis Athens, MD Primary Gastroenterologist:  Dr. Allen Norris     Reason for Consultation:     Weight loss and diarrhea        HPI:   Parker Conway is a 59 y.o. y/o male referred for consultation & management of Weight loss and diarrhea by Dr. Cletis Athens, MD.  This patient comes in today after being seen by his primary care provider with a history of a 25 pound weight loss and diarrhea.  The patient has been taking Imodium for his diarrhea. His last colonoscopy was 8 years ago. The patient reports that he has had a decreased appetite and only eats 1 meal a day.  The diarrhea has woken him up from sleep but that does not happen often.  His wife does report that the stool is very foul-smelling.  The patient has had some chest pain which she attributed to reflux and took multiple Tums and his wife's omeprazole with final resolution of the symptoms over time.  The patient does report that he had a colonoscopy 8 years ago and at that time he had polyps and was recommended to have a repeat colonoscopy in 3 years.  The patient did not follow-up with those recommendations.  There is no report of any history of alcohol abuse.  The patient denies any black stools or bloody stools although he states that he had some GI bleeding before his last colonoscopy but has had none since.  History reviewed. No pertinent past medical history.  Past Surgical History:  Procedure Laterality Date  . ANAL FISSURECTOMY      Prior to Admission medications   Medication Sig Start Date End Date Taking? Authorizing Provider  dimenhyDRINATE (DRAMAMINE) 50 MG tablet Take 0.5 tablets (25 mg total) by mouth every 8 (eight) hours as needed. 06/13/20   Cletis Athens, MD  naproxen sodium (ALEVE) 220 MG tablet Take 220 mg by mouth as needed.    [provider]    History reviewed. No pertinent family history.   Social  History   Tobacco Use  . Smoking status: Current Every Day Smoker    Types: Cigarettes  . Smokeless tobacco: Never Used  Substance Use Topics  . Alcohol use: Yes    Alcohol/week: 7.0 standard drinks    Types: 7 Cans of beer per week  . Drug use: No    Allergies as of 06/27/2020  . (No Known Allergies)    Review of Systems:    All systems reviewed and negative except where noted in HPI.   Physical Exam:  BP 124/70 (BP Location: Left Arm, Patient Position: Sitting, Cuff Size: Normal)   Pulse 70   Temp 98.2 F (36.8 C) (Oral)   Ht 6\' 2"  (1.88 m)   Wt 187 lb 4 oz (84.9 kg)   BMI 24.04 kg/m  No LMP for male patient. General:   Alert,  Well-developed, well-nourished, pleasant and cooperative in NAD Head:  Normocephalic and atraumatic. Eyes:  Sclera clear, no icterus.   Conjunctiva pink. Ears:  Normal auditory acuity. Neck:  Supple; no masses or thyromegaly. Lungs:  Respirations even and unlabored.  Clear throughout to auscultation.   No wheezes, crackles, or rhonchi. No acute distress. Heart:  Regular rate and rhythm; no murmurs, clicks, rubs, or gallops. Abdomen:  Normal bowel sounds.  No bruits.  Soft, non-tender and non-distended without masses, hepatosplenomegaly or hernias noted.  No guarding or rebound tenderness.  Negative Carnett sign.   Rectal:  Deferred.  Pulses:  Normal pulses noted. Extremities:  No clubbing or edema.  No cyanosis. Neurologic:  Alert and oriented x3;  grossly normal neurologically. Skin:  Intact without significant lesions or rashes.  No jaundice. Lymph Nodes:  No significant cervical adenopathy. Psych:  Alert and cooperative. Normal mood and affect.  Imaging Studies: DG Knee Complete 4 Views Left  Result Date: 06/07/2020 X-ray of the left knee demonstrates a mild varus alignment.  Severe medial compartment arthritis with complete loss of joint space, bone-on-bone articulation and subchondral sclerosis. Impression: Severe left knee arthritis  with varus deformity  DG Knee Complete 4 Views Right  Result Date: 06/07/2020 X-ray of the right knee demonstrates varus alignment.  Severe medial compartment arthritis with complete loss of joint space, with bone-on-bone articulation, subchondral sclerosis and associated osteophytes. Impression: Severe right knee arthritis with varus deformity.   Assessment and Plan:   Parker Conway is a 59 y.o. y/o male who comes in today with multiple medical issues including diarrhea, weight loss and epigastric pain.  The patient will be set up for an EGD and colonoscopy to rule out any GI pathology as the cause of his symptoms.  The patient will also be set up for a CT scan of the abdomen and pelvis due to his dramatic weight loss over the last 2-1/2 months.  The patient has been told that if these do not delineate the cause of his symptoms that he may need testing of his pancreas to make sure he does not have pancreatic insufficiency versus bacterial overgrowth as the cause of his weight loss and malabsorption.  The patient has been told to start a multivitamin since he has been having some neurological issues that may be vitamin deficiency related.  The patient has also been given samples of Dexilant for his epigastric pain which may be caused by reflux.  The patient and his wife have been explained the plan and agree with it.    Lucilla Lame, MD. [facility]    Note: This dictation was prepared with Dragon dictation along with smaller phrase technology. Any transcriptional errors that result from this process are unintentional.

## 2020-07-01 ENCOUNTER — Other Ambulatory Visit: Payer: Self-pay

## 2020-07-01 ENCOUNTER — Ambulatory Visit
Admission: RE | Admit: 2020-07-01 | Discharge: 2020-07-01 | Disposition: A | Discharge status: Home / Self Care | Payer: 59 | Source: Ambulatory Visit | Attending: Gastroenterology | Admitting: Gastroenterology

## 2020-07-01 DIAGNOSIS — R197 Diarrhea, unspecified: Secondary | ICD-10-CM | POA: Insufficient documentation

## 2020-07-01 MED ORDER — IOHEXOL 300 MG/ML  SOLN
100.0000 mL | Freq: Once | INTRAMUSCULAR | Status: AC | PRN
  Administered 2020-07-01: 100 mL via INTRAVENOUS

## 2020-07-05 ENCOUNTER — Telehealth: Payer: Self-pay

## 2020-07-05 NOTE — Telephone Encounter (Signed)
-----   Message from Lucilla Lame, MD sent at 07/04/2020  6:52 PM EST ----- With the patient know that the CT scan of his abdomen did not show any abnormalities to explain his symptoms.

## 2020-07-05 NOTE — Telephone Encounter (Signed)
Pt notified of results via mychart.  

## 2020-07-08 ENCOUNTER — Other Ambulatory Visit
Admission: RE | Admit: 2020-07-08 | Discharge: 2020-07-08 | Disposition: A | Discharge status: Home / Self Care | Payer: 59 | Source: Ambulatory Visit | Attending: Gastroenterology | Admitting: Gastroenterology

## 2020-07-08 DIAGNOSIS — Z01818 Encounter for other preprocedural examination: Secondary | ICD-10-CM | POA: Insufficient documentation

## 2020-07-08 DIAGNOSIS — Z20822 Contact with and (suspected) exposure to covid-19: Secondary | ICD-10-CM | POA: Diagnosis not present

## 2020-07-11 ENCOUNTER — Other Ambulatory Visit: Payer: Self-pay

## 2020-07-11 LAB — SARS CORONAVIRUS 2 (TAT 6-24 HRS): SARS Coronavirus 2: NEGATIVE

## 2020-07-12 ENCOUNTER — Ambulatory Visit
Admission: RE | Admit: 2020-07-12 | Discharge: 2020-07-12 | Disposition: A | Discharge status: Home / Self Care | Payer: 59 | Attending: Gastroenterology | Admitting: Gastroenterology

## 2020-07-12 ENCOUNTER — Ambulatory Visit: Payer: 59 | Admitting: Certified Registered"

## 2020-07-12 ENCOUNTER — Encounter
Admission: RE | Disposition: A | Discharge status: Home / Self Care | Payer: Self-pay | Source: Home / Self Care | Attending: Gastroenterology

## 2020-07-12 ENCOUNTER — Encounter: Payer: Self-pay | Admitting: Gastroenterology

## 2020-07-12 DIAGNOSIS — K2950 Unspecified chronic gastritis without bleeding: Secondary | ICD-10-CM | POA: Diagnosis not present

## 2020-07-12 DIAGNOSIS — K529 Noninfective gastroenteritis and colitis, unspecified: Secondary | ICD-10-CM | POA: Diagnosis not present

## 2020-07-12 DIAGNOSIS — R1013 Epigastric pain: Secondary | ICD-10-CM | POA: Diagnosis not present

## 2020-07-12 DIAGNOSIS — K635 Polyp of colon: Secondary | ICD-10-CM

## 2020-07-12 DIAGNOSIS — R634 Abnormal weight loss: Secondary | ICD-10-CM | POA: Diagnosis not present

## 2020-07-12 DIAGNOSIS — R197 Diarrhea, unspecified: Secondary | ICD-10-CM | POA: Diagnosis not present

## 2020-07-12 DIAGNOSIS — F17210 Nicotine dependence, cigarettes, uncomplicated: Secondary | ICD-10-CM | POA: Diagnosis not present

## 2020-07-12 DIAGNOSIS — D123 Benign neoplasm of transverse colon: Secondary | ICD-10-CM | POA: Insufficient documentation

## 2020-07-12 HISTORY — PX: ESOPHAGOGASTRODUODENOSCOPY (EGD) WITH PROPOFOL: SHX5813

## 2020-07-12 HISTORY — PX: COLONOSCOPY WITH PROPOFOL: SHX5780

## 2020-07-12 SURGERY — COLONOSCOPY WITH PROPOFOL
Anesthesia: General

## 2020-07-12 MED ORDER — PROPOFOL 10 MG/ML IV BOLUS
INTRAVENOUS | Status: DC | PRN
  Administered 2020-07-12: 50 mg via INTRAVENOUS
  Administered 2020-07-12 (×2): 10 mg via INTRAVENOUS

## 2020-07-12 MED ORDER — SODIUM CHLORIDE 0.9 % IV SOLN: INTRAVENOUS | Status: DC

## 2020-07-12 MED ORDER — PROPOFOL 500 MG/50ML IV EMUL
INTRAVENOUS | Status: DC | PRN
  Administered 2020-07-12: 145 ug/kg/min via INTRAVENOUS

## 2020-07-12 MED ORDER — LIDOCAINE HCL (CARDIAC) PF 100 MG/5ML IV SOSY
PREFILLED_SYRINGE | INTRAVENOUS | Status: DC | PRN
  Administered 2020-07-12: 100 mg via INTRAVENOUS

## 2020-07-12 MED ORDER — GLYCOPYRROLATE 0.2 MG/ML IJ SOLN
INTRAMUSCULAR | Status: DC | PRN
  Administered 2020-07-12: .2 mg via INTRAVENOUS

## 2020-07-12 NOTE — Transfer of Care (Signed)
Immediate Anesthesia Transfer of Care Note  Patient: Parker Conway  Procedure(s) Performed: COLONOSCOPY WITH PROPOFOL (N/A ) ESOPHAGOGASTRODUODENOSCOPY (EGD) WITH PROPOFOL (N/A )  Patient Location: Endoscopy Unit  Anesthesia Type:General  Level of Consciousness: awake, drowsy and patient cooperative  Airway & Oxygen Therapy: Patient Spontanous Breathing  Post-op Assessment: Report given to RN and Post -op Vital signs reviewed and stable  Post vital signs: Reviewed and stable  Last Vitals:  Vitals Value Taken Time  BP    Temp    Pulse    Resp    SpO2      Last Pain:  Vitals:   07/12/20 1040  TempSrc: Temporal  PainSc: 0-No pain         Complications: No complications documented.

## 2020-07-12 NOTE — Op Note (Signed)
[facility] Gastroenterology Patient Name: Parker Conway Procedure Date: 07/12/2020 12:02 PM MRN: [id number] Account #: [id number] Date of Birth: February 07, 1961 Admit Type: Outpatient Age: 59 Room: [facility] ENDO ROOM 4 Gender: Male Note Status: Finalized Procedure:             Colonoscopy Indications:           Chronic diarrhea, Weight loss Providers:             Lucilla Lame MD, MD Referring MD:          Cletis Athens, MD (Referring MD) Medicines:             Propofol per Anesthesia Complications:         No immediate complications. Procedure:             Pre-Anesthesia Assessment:                        - Prior to the procedure, a History and Physical was                         performed, and patient medications and allergies were                         reviewed. The patient's tolerance of previous                         anesthesia was also reviewed. The risks and benefits                         of the procedure and the sedation options and risks                         were discussed with the patient. All questions were                         answered, and informed consent was obtained. Prior                         Anticoagulants: The patient has taken no previous                         anticoagulant or antiplatelet agents. ASA Grade                         Assessment: II - A patient with mild systemic disease.                         After reviewing the risks and benefits, the patient                         was deemed in satisfactory condition to undergo the                         procedure.                        After obtaining informed consent, the colonoscope was  passed under direct vision. Throughout the procedure,                         the patient's blood pressure, pulse, and oxygen                         saturations were monitored continuously. The                         Colonoscope was introduced through the anus and                          advanced to the the terminal ileum. The colonoscopy                         was performed without difficulty. The patient                         tolerated the procedure well. The quality of the bowel                         preparation was excellent. Findings:      The perianal and digital rectal examinations were normal.      A 4 mm polyp was found in the transverse colon. The polyp was sessile.       The polyp was removed with a cold biopsy forceps. Resection and       retrieval were complete.      The terminal ileum appeared normal. Biopsies were taken with a cold       forceps for histology.      Random biopsies were obtained with cold forceps for histology randomly       in the entire colon. Impression:            - One 4 mm polyp in the transverse colon, removed with                         a cold biopsy forceps. Resected and retrieved.                        - The examined portion of the ileum was normal.                         Biopsied.                        - Random biopsies were obtained in the entire colon. Recommendation:        - Discharge patient to home.                        - Resume previous diet.                        - Continue present medications.                        - Await pathology results. Procedure Code(s):     --- Professional ---                        [phone],  Colonoscopy, flexible; with biopsy, single or                         multiple Diagnosis Code(s):     --- Professional ---                        R63.4, Abnormal weight loss                        K52.9, Noninfective gastroenteritis and colitis,                         unspecified                        K63.5, Polyp of colon CPT copyright 2019 American Medical Association. All rights reserved. The codes documented in this report are preliminary and upon coder review may  be revised to meet current compliance requirements. Lucilla Lame MD, MD 07/12/2020 12:27:28 PM This  report has been signed electronically. Number of Addenda: 0 Note Initiated On: 07/12/2020 12:02 PM Scope Withdrawal Time: 0 hours 8 minutes 7 seconds  Total Procedure Duration: 0 hours 11 minutes 41 seconds  Estimated Blood Loss:  Estimated blood loss: none.      [facility]

## 2020-07-12 NOTE — Anesthesia Preprocedure Evaluation (Signed)
Anesthesia Evaluation  Patient identified by MRN, date of birth, ID band Patient awake    Reviewed: Allergy & Precautions, H&P , NPO status , Patient's Chart, lab work & pertinent test results, reviewed documented beta blocker date and time   History of Anesthesia Complications Negative for: history of anesthetic complications  Airway Mallampati: II  TM Distance: >3 FB Neck ROM: full    Dental  (+) Partial Lower, Edentulous Upper, Dental Advidsory Given   Pulmonary neg shortness of breath, neg COPD, neg recent URI, Current Smoker,    Pulmonary exam normal breath sounds clear to auscultation       Cardiovascular Exercise Tolerance: Good negative cardio ROS Normal cardiovascular exam Rhythm:regular Rate:Normal     Neuro/Psych negative neurological ROS  negative psych ROS   GI/Hepatic negative GI ROS, Neg liver ROS,   Endo/Other  negative endocrine ROS  Renal/GU negative Renal ROS  negative genitourinary   Musculoskeletal   Abdominal   Peds  Hematology negative hematology ROS (+)   Anesthesia Other Findings History reviewed. No pertinent past medical history.   Reproductive/Obstetrics negative OB ROS                             Anesthesia Physical Anesthesia Plan  ASA: II  Anesthesia Plan: General   Post-op Pain Management:    Induction: Intravenous  PONV Risk Score and Plan: 1 and Propofol infusion and TIVA  Airway Management Planned: Natural Airway and Nasal Cannula  Additional Equipment:   Intra-op Plan:   Post-operative Plan:   Informed Consent: I have reviewed the patients History and Physical, chart, labs and discussed the procedure including the risks, benefits and alternatives for the proposed anesthesia with the patient or authorized representative who has indicated his/her understanding and acceptance.     Dental Advisory Given  Plan Discussed with:  Anesthesiologist, CRNA and Surgeon  Anesthesia Plan Comments:         Anesthesia Quick Evaluation

## 2020-07-12 NOTE — Op Note (Signed)
[facility] Gastroenterology Patient Name: Parker Conway Procedure Date: 07/12/2020 12:03 PM MRN: [id number] Account #: [id number] Date of Birth: 1961/02/25 Admit Type: Outpatient Age: 59 Room: [facility] ENDO ROOM 4 Gender: Male Note Status: Finalized Procedure:             Upper GI endoscopy Indications:           Epigastric abdominal pain, Weight loss Providers:             Lucilla Lame MD, MD Referring MD:          Cletis Athens, MD (Referring MD) Medicines:             Propofol per Anesthesia Complications:         No immediate complications. Procedure:             Pre-Anesthesia Assessment:                        - Prior to the procedure, a History and Physical was                         performed, and patient medications and allergies were                         reviewed. The patient's tolerance of previous                         anesthesia was also reviewed. The risks and benefits                         of the procedure and the sedation options and risks                         were discussed with the patient. All questions were                         answered, and informed consent was obtained. Prior                         Anticoagulants: The patient has taken no previous                         anticoagulant or antiplatelet agents. ASA Grade                         Assessment: II - A patient with mild systemic disease.                         After reviewing the risks and benefits, the patient                         was deemed in satisfactory condition to undergo the                         procedure.                        After obtaining informed consent, the endoscope was  passed under direct vision. Throughout the procedure,                         the patient's blood pressure, pulse, and oxygen                         saturations were monitored continuously. The Endoscope                         was introduced through the  mouth, and advanced to the                         second part of duodenum. The upper GI endoscopy was                         accomplished without difficulty. The patient tolerated                         the procedure well. Findings:      The examined esophagus was normal.      Diffuse moderate inflammation characterized by erosions was found in the       gastric antrum. Biopsies were taken with a cold forceps for histology.      The examined duodenum was normal. Impression:            - Normal esophagus.                        - Gastritis. Biopsied.                        - Normal examined duodenum. Recommendation:        - Discharge patient to home.                        - Resume previous diet.                        - Continue present medications.                        - Await pathology results.                        - Perform a colonoscopy today. Procedure Code(s):     --- Professional ---                        [phone], Esophagogastroduodenoscopy, flexible,                         transoral; with biopsy, single or multiple Diagnosis Code(s):     --- Professional ---                        R10.13, Epigastric pain                        R63.4, Abnormal weight loss                        K29.70, Gastritis, unspecified, without bleeding CPT copyright 2019 American Medical Association. All rights reserved. The  codes documented in this report are preliminary and upon coder review may  be revised to meet current compliance requirements. Lucilla Lame MD, MD 07/12/2020 12:11:38 PM This report has been signed electronically. Number of Addenda: 0 Note Initiated On: 07/12/2020 12:03 PM Estimated Blood Loss:  Estimated blood loss: none.      [facility]

## 2020-07-12 NOTE — Interval H&P Note (Signed)
Lucilla Lame, MD [address] [place], [place] [address] Phone:[phone] Fax : [phone]  Primary Care Physician:  Cletis Athens, MD Primary Gastroenterologist:  Dr. Allen Norris  Pre-Procedure History & Physical: HPI:  Parker Conway is a 59 y.o. male is here for an endoscopy and colonoscopy.   History reviewed. No pertinent past medical history.  Past Surgical History:  Procedure Laterality Date   ANAL FISSURECTOMY      Prior to Admission medications   Medication Sig Start Date End Date Taking? Authorizing Provider  dimenhyDRINATE (DRAMAMINE) 50 MG tablet Take 0.5 tablets (25 mg total) by mouth every 8 (eight) hours as needed. 06/13/20  Yes Masoud, Viann Shove, MD  naproxen sodium (ALEVE) 220 MG tablet Take 220 mg by mouth as needed.   Yes [provider]    Allergies as of 06/27/2020   (No Known Allergies)    History reviewed. No pertinent family history.  Social History   Socioeconomic History   Marital status: Married    Spouse name: Not on file   Number of children: Not on file   Years of education: Not on file   Highest education level: Not on file  Occupational History   Not on file  Tobacco Use   Smoking status: Current Every Day Smoker    Types: Cigarettes   Smokeless tobacco: Never Used  Substance and Sexual Activity   Alcohol use: Yes    Alcohol/week: 7.0 standard drinks    Types: 7 Cans of beer per week   Drug use: No   Sexual activity: Not on file  Other Topics Concern   Not on file  Social History Narrative   Not on file   Social Determinants of Health   Financial Resource Strain:    Difficulty of Paying Living Expenses: Not on file  Food Insecurity:    Worried About [place] in the Last Year: Not on file   Ran Out of Food in the Last Year: Not on file  Transportation Needs:    Lack of Transportation (Medical): Not on file   Lack of Transportation (Non-Medical): Not on file  Physical Activity:    Days of  Exercise per Week: Not on file   Minutes of Exercise per Session: Not on file  Stress:    Feeling of Stress : Not on file  Social Connections:    Frequency of Communication with Friends and Family: Not on file   Frequency of Social Gatherings with Friends and Family: Not on file   Attends Religious Services: Not on file   Active Member of Clubs or Organizations: Not on file   Attends Archivist Meetings: Not on file   Marital Status: Not on file  Intimate Partner Violence:    Fear of Current or Ex-Partner: Not on file   Emotionally Abused: Not on file   Physically Abused: Not on file   Sexually Abused: Not on file    Review of Systems: See HPI, otherwise negative ROS  Physical Exam: BP (!) 143/89   Pulse 61   Temp 98 F (36.7 C) (Temporal)   Resp 16   Ht 6\' 2"  (1.88 m)   Wt 84.8 kg   SpO2 100%   BMI 24.01 kg/m  General:   Alert,  pleasant and cooperative in NAD Head:  Normocephalic and atraumatic. Neck:  Supple; no masses or thyromegaly. Lungs:  Clear throughout to auscultation.    Heart:  Regular rate and rhythm. Abdomen:  Soft,  nontender and nondistended. Normal bowel sounds, without guarding, and without rebound.   Neurologic:  Alert and  oriented x4;  grossly normal neurologically.  Impression/Plan: Parker Conway is here for an endoscopy and colonoscopy to be performed for weight loss and diarrhea  Risks, benefits, limitations, and alternatives regarding  endoscopy and colonoscopy have been reviewed with the patient.  Questions have been answered.  All parties agreeable.   Lucilla Lame, MD  07/12/2020, 11:00 AM

## 2020-07-12 NOTE — Anesthesia Procedure Notes (Signed)
Procedure Name: General with mask airway Performed by: Fletcher-Harrison, Ishia Tenorio, CRNA Pre-anesthesia Checklist: Patient identified, Emergency Drugs available, Suction available and Patient being monitored Patient Re-evaluated:Patient Re-evaluated prior to induction Oxygen Delivery Method: Simple face mask Induction Type: IV induction Placement Confirmation: positive ETCO2 and CO2 detector Dental Injury: Teeth and Oropharynx as per pre-operative assessment        

## 2020-07-13 ENCOUNTER — Encounter: Payer: Self-pay | Admitting: Gastroenterology

## 2020-07-14 ENCOUNTER — Other Ambulatory Visit: Payer: Self-pay

## 2020-07-14 LAB — SURGICAL PATHOLOGY

## 2020-07-14 MED ORDER — DEXLANSOPRAZOLE 60 MG PO CPDR: 60.0000 mg | DELAYED_RELEASE_CAPSULE | Freq: Every day | ORAL | 6 refills | Status: DC

## 2020-07-14 NOTE — Anesthesia Postprocedure Evaluation (Signed)
Anesthesia Post Note  Patient: Parker Conway  Procedure(s) Performed: COLONOSCOPY WITH PROPOFOL (N/A ) ESOPHAGOGASTRODUODENOSCOPY (EGD) WITH PROPOFOL (N/A )  Patient location during evaluation: PACU Anesthesia Type: General Level of consciousness: awake and alert Pain management: pain level controlled Vital Signs Assessment: post-procedure vital signs reviewed and stable Respiratory status: spontaneous breathing, nonlabored ventilation and respiratory function stable Cardiovascular status: blood pressure returned to baseline and stable Postop Assessment: no apparent nausea or vomiting Anesthetic complications: no   No complications documented.   Last Vitals:  Vitals:   07/12/20 1238 07/12/20 1248  BP: (!) 152/99 (!) 150/103  Pulse: (!) 59 77  Resp: 15 16  Temp:    SpO2: 100% 100%    Last Pain:  Vitals:   07/13/20 0754  TempSrc:   PainSc: 0-No pain                 Tera Mater

## 2020-07-15 ENCOUNTER — Encounter: Payer: Self-pay | Admitting: Gastroenterology

## 2020-07-18 ENCOUNTER — Encounter (INDEPENDENT_AMBULATORY_CARE_PROVIDER_SITE_OTHER): Payer: Self-pay | Admitting: Otolaryngology

## 2020-07-18 ENCOUNTER — Ambulatory Visit (INDEPENDENT_AMBULATORY_CARE_PROVIDER_SITE_OTHER): Payer: 59 | Admitting: Otolaryngology

## 2020-07-18 ENCOUNTER — Other Ambulatory Visit: Payer: Self-pay

## 2020-07-18 VITALS — Temp 97.2°F

## 2020-07-18 DIAGNOSIS — R42 Dizziness and giddiness: Secondary | ICD-10-CM | POA: Diagnosis not present

## 2020-07-18 DIAGNOSIS — H6123 Impacted cerumen, bilateral: Secondary | ICD-10-CM | POA: Diagnosis not present

## 2020-07-18 NOTE — Progress Notes (Signed)
HPI: Parker Conway is a 59 y.o. male who presents is referred by his PCP for evaluation of dizziness. He has an appointment to see neurologist later this week. He states that he has had problems with dizziness for several years. More recently he has noticed some blurry vision and double vision that comes and goes. He apparently also blacked out. The dizziness has been worse recently. He had an MRI scan that was reportedly normal. He has decreased hearing according to his wife he has some pain in the left ear. His dizziness complaints is not really true vertigo and does not seem to be consistent with BPPV.Marland Kitchen  No past medical history on file. Past Surgical History:  Procedure Laterality Date  . ANAL FISSURECTOMY    . COLONOSCOPY WITH PROPOFOL N/A 07/12/2020   Procedure: COLONOSCOPY WITH PROPOFOL;  Surgeon: Lucilla Lame, MD;  Location: [facility] ENDOSCOPY;  Service: Endoscopy;  Laterality: N/A;  . ESOPHAGOGASTRODUODENOSCOPY (EGD) WITH PROPOFOL N/A 07/12/2020   Procedure: ESOPHAGOGASTRODUODENOSCOPY (EGD) WITH PROPOFOL;  Surgeon: Lucilla Lame, MD;  Location: [facility] ENDOSCOPY;  Service: Endoscopy;  Laterality: N/A;   Social History   Socioeconomic History  . Marital status: Married    Spouse name: Not on file  . Number of children: Not on file  . Years of education: Not on file  . Highest education level: Not on file  Occupational History  . Not on file  Tobacco Use  . Smoking status: Current Every Day Smoker    Packs/day: 1.00    Years: 22.00    Pack years: 22.00    Types: Cigarettes    Start date: 64  . Smokeless tobacco: Never Used  Substance and Sexual Activity  . Alcohol use: Yes    Alcohol/week: 7.0 standard drinks    Types: 7 Cans of beer per week  . Drug use: No  . Sexual activity: Not on file  Other Topics Concern  . Not on file  Social History Narrative  . Not on file   Social Determinants of Health   Financial Resource Strain:   . Difficulty of Paying Living Expenses:  Not on file  Food Insecurity:   . Worried About Charity fundraiser in the Last Year: Not on file  . Ran Out of Food in the Last Year: Not on file  Transportation Needs:   . Lack of Transportation (Medical): Not on file  . Lack of Transportation (Non-Medical): Not on file  Physical Activity:   . Days of Exercise per Week: Not on file  . Minutes of Exercise per Session: Not on file  Stress:   . Feeling of Stress : Not on file  Social Connections:   . Frequency of Communication with Friends and Family: Not on file  . Frequency of Social Gatherings with Friends and Family: Not on file  . Attends Religious Services: Not on file  . Active Member of Clubs or Organizations: Not on file  . Attends Archivist Meetings: Not on file  . Marital Status: Not on file   No family history on file. No Known Allergies Prior to Admission medications   Medication Sig Start Date End Date Taking? Authorizing Provider  dexlansoprazole (DEXILANT) 60 MG capsule Take 1 capsule (60 mg total) by mouth daily. 07/14/20  Yes Lucilla Lame, MD  dimenhyDRINATE (DRAMAMINE) 50 MG tablet Take 0.5 tablets (25 mg total) by mouth every 8 (eight) hours as needed. 06/13/20  Yes Masoud, Viann Shove, MD  naproxen sodium (ALEVE) 220 MG tablet Take  220 mg by mouth as needed.   Yes [provider]     Positive ROS: Otherwise negative  All other systems have been reviewed and were otherwise negative with the exception of those mentioned in the HPI and as above.  Physical Exam: Constitutional: Alert, well-appearing, no acute distress Ears: External ears without lesions or tenderness. Right ear canal with minimal cerumen that was cleaned with a curette. The right TM was clear with good mobility on pneumatic otoscopy. The left TM was completely occluded with cerumen that caused some pain on movement of cerumen. This was able to be cleaned with irrigation and suction. After cleaning the ear canal the ear felt much better  he had better hearing on the left side. On tuning fork testing he heard about the same in both ears with good hearing in both ears using the 1024 tuning fork subjectively. Dix-Hallpike testing revealed no evidence of BPPV. Nasal: External nose without lesions. Septum midline. Clear nasal passages Oral: Lips and gums without lesions. Tongue and palate mucosa without lesions. Posterior oropharynx clear. Neck: No palpable adenopathy or masses Respiratory: Breathing comfortably  Skin: No facial/neck lesions or rash noted.  Cerumen impaction removal  Date/Time: 07/18/2020 1:38 PM Performed by: Rozetta Nunnery, MD Authorized by: Rozetta Nunnery, MD   Consent:    Consent obtained:  Verbal   Consent given by:  Patient   Risks discussed:  Pain and bleeding Procedure details:    Location:  L ear and R ear   Procedure type: curette, irrigation and suction   Post-procedure details:    Inspection:  TM intact and canal normal   Hearing quality:  Improved   Patient tolerance of procedure:  Tolerated well, no immediate complications Comments:     Left ear canal was completely occluded with cerumen that was cleaned with irrigation and suction. Right ear canal had moderate cerumen that was cleaned with curette. Both TMs were clear.    Assessment: Dizziness questionable etiology.  With the associated symptoms of blacking out as well as visual problems would strongly recommend neurology evaluation which patient is already scheduled for. Large amount of cerumen impaction in the left ear canal because of blockage of his hearing on the left side but this was cleaned and he had good hearing in both ears with no evidence of BPPV or Mnire's disease.  Plan: Ear canals were cleaned in the office today. Concerning his dizziness would recommend follow-up with neurology   Radene Journey, MD   CC:

## 2020-07-21 ENCOUNTER — Other Ambulatory Visit: Payer: Self-pay

## 2020-07-21 ENCOUNTER — Encounter: Payer: Self-pay | Admitting: Neurology

## 2020-07-21 ENCOUNTER — Ambulatory Visit (INDEPENDENT_AMBULATORY_CARE_PROVIDER_SITE_OTHER): Payer: 59 | Admitting: Neurology

## 2020-07-21 VITALS — Ht 74.0 in | Wt 188.0 lb

## 2020-07-21 DIAGNOSIS — R42 Dizziness and giddiness: Secondary | ICD-10-CM | POA: Diagnosis not present

## 2020-07-21 DIAGNOSIS — R634 Abnormal weight loss: Secondary | ICD-10-CM

## 2020-07-21 DIAGNOSIS — Z87898 Personal history of other specified conditions: Secondary | ICD-10-CM

## 2020-07-21 DIAGNOSIS — H532 Diplopia: Secondary | ICD-10-CM

## 2020-07-21 DIAGNOSIS — R251 Tremor, unspecified: Secondary | ICD-10-CM | POA: Diagnosis not present

## 2020-07-21 NOTE — Progress Notes (Signed)
Subjective:    Patient ID: Parker Conway is a 59 y.o. male.  HPI     Star Age, MD, PhD [facility] [address] [place], [place] [address]  Dear Dr. Lavera Guise,   I saw your patient, Parker Conway, upon your kind request, in my Clinic today for initial consultation of his dizziness of several months duration, per patient, 4 to 5 months duration, per wife, about 6 months in duration.  The patient is accompanied by his wife today.  As you know, Parker Conway is a 59 year old right-handed gentleman with an underlying previously benign medical history except smoking and recent abdominal pain as well as diarrhea, nausea, vomiting and weight loss, who reports a several month history of intermittent dizzy spells, he describes a heaviness in his head at times associated with double vision either side to side or pictures on top of each other.  These are transient, he does have some blurry vision intermittently.  Of note, he has not seen an eye doctor in years.  His wife reports that he does not like to go to the doctors and does not like to take medications.  His headaches are intermittent, global, not severe, he does not take any over-the-counter medication for these.  He does not have any history of sudden onset of one-sided weakness or numbness or tingling or droopy face or slurring of speech.  He had a passing out spell several months ago.  He did not go to the hospital but EMS was called.  He has experienced intermittent diarrhea and nausea.  He denies any spinning sensation of feeling lightheaded upon standing.  He hydrates fairly well, mostly drinks diluted juice, about half a gallon per day but not much in the way of water itself, couple cups of coffee, occasional alcohol, smokes about half a pack per day.  He is supposed to have a chest x-ray.  He had a CT abdomen in November which was benign. He had a head CT without contrast on 05/16/2020 and I  reviewed the results:  IMPRESSION: 1. No acute intracranial pathology. 2. No mass or lymphadenopathy in the neck. 3. Paraseptal emphysema. 4. Atherosclerotic changes in the bilateral carotid bifurcation with soft plaques. No hemodynamically significant stenosis. Per wife, in the past few months he has experienced unintentional weight loss of approximately 40 pounds.    He had a colonoscopy on 07/12/2020, one polyp was found. He had a recent consultation with ENT, Dr. Lucia Gaskins on 07/18/2020 and he was found to have bilateral cerumen impaction, he was not deemed to have vertigo.  His wife has noted an intermittent head tremor side to side, it is very subtle, he is not aware of it typically.   His Past Medical History Is Significant For: No past medical history on file.  His Past Surgical History Is Significant For: Past Surgical History:  Procedure Laterality Date  . ANAL FISSURECTOMY    . COLONOSCOPY WITH PROPOFOL N/A 07/12/2020   Procedure: COLONOSCOPY WITH PROPOFOL;  Surgeon: Lucilla Lame, MD;  [facility] ENDOSCOPY;  Service: Endoscopy;  Laterality: N/A;  . ESOPHAGOGASTRODUODENOSCOPY (EGD) WITH PROPOFOL N/A 07/12/2020   Procedure: ESOPHAGOGASTRODUODENOSCOPY (EGD) WITH PROPOFOL;  Surgeon: Lucilla Lame, MD;  Location: [facility] ENDOSCOPY;  Service: Endoscopy;  Laterality: N/A;    His Family History Is Significant For: No family history on file.  His Social History Is Significant For: Social History   Socioeconomic History  . Marital status: Married    Spouse name:  Not on file  . Number of children: Not on file  . Years of education: Not on file  . Highest education level: Not on file  Occupational History  . Not on file  Tobacco Use  . Smoking status: Current Every Day Smoker    Packs/day: 1.00    Years: 22.00    Pack years: 22.00    Types: Cigarettes    Start date: 47  . Smokeless tobacco: Never Used  Substance and Sexual Activity  . Alcohol use: Yes    Alcohol/week:  7.0 standard drinks    Types: 7 Cans of beer per week  . Drug use: No  . Sexual activity: Not on file  Other Topics Concern  . Not on file  Social History Narrative  . Not on file   Social Determinants of Health   Financial Resource Strain: Not on file  Food Insecurity: Not on file  Transportation Needs: Not on file  Physical Activity: Not on file  Stress: Not on file  Social Connections: Not on file    His Allergies Are:  No Known Allergies:   His Current Medications Are:  Outpatient Encounter Medications as of 07/21/2020  Medication Sig  . b complex vitamins capsule Take 1 capsule by mouth daily.  Marland Kitchen dimenhyDRINATE (DRAMAMINE) 50 MG tablet Take 0.5 tablets (25 mg total) by mouth every 8 (eight) hours as needed.  Marland Kitchen dexlansoprazole (DEXILANT) 60 MG capsule Take 1 capsule (60 mg total) by mouth daily. (Patient not taking: Reported on 07/21/2020)  . naproxen sodium (ALEVE) 220 MG tablet Take 220 mg by mouth as needed. (Patient not taking: Reported on 07/21/2020)  . omeprazole (PRILOSEC) 40 MG capsule Take 40 mg by mouth daily.   No facility-administered encounter medications on file as of 07/21/2020.  :   Review of Systems:  Out of a complete 14 point review of systems, all are reviewed and negative with the exception of these symptoms as listed below:  Review of Systems  Neurological:       New pt here with wife, Parker Conway for dizziness. May occur when sitting or changing positions. Have had issue x 4-5 months, getting more frequent now. Has fallen, hit head once. CT scan done, normal. Occasional slight rhythmic movement of head.    Objective:  Neurological Exam  Physical Exam Physical Examination:   There were no vitals filed for this visit.  On orthostatic testing, Blood pressure and pulse 111/66 with a pulse of 64, sitting 125/81 with a pulse of 66, standing 116/77 with a pulse of 75.  General Examination: The patient is a very pleasant 59 y.o. male in no acute distress.  He appears well-developed and well-nourished and well groomed.   HEENT: Normocephalic, atraumatic, pupils are equal, round and reactive to light and accommodation. Funduscopic exam is difficult secondary to bilateral cataracts noted.  He denies any vertiginous symptoms with sudden change in head and neck position.  Extraocular tracking is good without limitation to gaze excursion or nystagmus noted. Normal smooth pursuit is noted. Hearing is grossly intact. Face is symmetric with normal facial animation and normal facial sensation. Speech is clear with no dysarthria noted. There is no hypophonia. There is no lip, neck/head, jaw or voice tremor. Neck is supple with full range of passive and active motion. There are no carotid bruits on auscultation. Oropharynx exam reveals: moderate mouth dryness, adequate dental hygiene on the bottom with several teeth missing and edentulous on top, moderate mouth dryness, tongue protrudes centrally and  palate elevates symmetrically.    Chest: Clear to auscultation without wheezing, rhonchi or crackles noted.  Heart: S1+S2+0, regular and normal without murmurs, rubs or gallops noted.   Abdomen: Soft, non-tender and non-distended with normal bowel sounds appreciated on auscultation.  Extremities: There is no pitting edema in the distal lower extremities bilaterally. Pedal pulses are intact.  Skin: Warm and dry without trophic changes noted.  Musculoskeletal: exam reveals no obvious joint deformities, tenderness or joint swelling or erythema.   Neurologically:  Mental status: The patient is awake, alert and oriented in all 4 spheres. His immediate and remote memory, attention, language skills and fund of knowledge are appropriate. There is no evidence of aphasia, agnosia, apraxia or anomia. Speech is clear with normal prosody and enunciation. Thought process is linear. Mood is normal and affect is normal.  Cranial nerves II - XII are as described above under HEENT  exam. In addition: shoulder shrug is normal with equal shoulder height noted. Motor exam: Normal bulk, strength and tone is noted. There is no drift, tremor or rebound. Romberg is negative. Reflexes are 2+ throughout. Babinski: Toes are flexor bilaterally. Fine motor skills and coordination: intact with normal finger taps, normal hand movements, normal rapid alternating patting, normal foot taps and normal foot agility.  Cerebellar testing: No dysmetria or intention tremor on finger to nose testing. Heel to shin is unremarkable bilaterally. There is no truncal or gait ataxia.  Sensory exam: intact to light touch, vibration, temperature sense in the upper and lower extremities.  Gait, station and balance: He stands somewhat slowly.  Initially he stands slightly wider base but can stand narrow based, walking is slightly slow and cautious but without any shuffling, he has preserved arm swing.  No ataxia.  Tandem walk is slow but doable.   Assessment and Plan:   In summary, Parker Conway is a very pleasant 59 y.o.-year old male with an underlying previously benign medical history except smoking and recent abdominal pain as well as diarrhea, nausea, vomiting and weight loss, who presents for evaluation of his dizziness of 4-5, even 6 months duration.  I do not have a good explanation for his dizziness.  Neurological exam is nonfocal and dizziness appears to be nonspecific.  Vertigo is unlikely.  He does not have any cerebellar findings such as ataxia, dysmetria, or dysdiadochokinesia.  He has had some unintentional weight loss and given his smoking history, work-up for weight loss and for underlying cancer is indicated.  He is advised to talk to about further work-up, as I can see he is supposed to have a chest x-ray.  He recently had a colonoscopy already.  We will do some blood work today including B12, inflammatory markers, autoimmune markers, CMP.  I would like to proceed with a brain MRI with and without  contrast to rule out a structural cause of his symptoms.   He does not have any focal findings or findings in keeping with parkinsonism.  I could not do a funduscopic exam reliably today, he does appear to have cataracts.  He has not had a formal eye examination and given his complaint of intermittent double vision, I recommended that he seek consultation for a full evaluation and eye examination with an ophthalmologist.  He is advised to talk to you about a referral if need be.  He is advised to follow-up with you as scheduled.  We will keep him posted as to his test results by phone call including blood test results and  MRI results and follow-up in this clinic if needed.  He is reminded to stay well-hydrated with water and quit smoking.  Should he have any sudden onset of loss of consciousness, one-sided weakness or numbness or tingling, severe headache, droopy face or slurring of speech, he is strongly advised to call EMS or go to the nearest ER.  I answered all their questions today and the patient and his wife are in agreement.  Thank you very much for allowing me to participate in the care of this nice patient. If I can be of any further assistance to you please do not hesitate to call me at [phone].  Sincerely,   Star Age, MD, PhD

## 2020-07-21 NOTE — Patient Instructions (Signed)
I am not sure how to explain your dizziness.  Unfortunately, dizziness is a very common complaint but is often not due to a primary neurological reason or single underlying medical problem. Often, there a combination of factors, that result in dizziness. This includes blood pressure fluctuations, medication side effects, blood sugar fluctuations, stress, vertigo, poor sleep with sleep deprivation, dehydration, and electrolyte disturbance or other metabolic and endocrinological reasons, meaning hormone related problems such as thyroid dysfunction. We will investigate things further with a brain MRI. We will call you with the test results. We will also do some blood work today as you are experiencing unintentional weight loss.  Please follow-up with your primary care physician for now, if we need to follow-up in this clinic after testing, we will let you know.  Since you have intermittent double vision, I would recommend that you get a full eye examination with an ophthalmologist.  If you need a referral, please talk to your primary care physician about one.

## 2020-07-22 ENCOUNTER — Other Ambulatory Visit: Payer: Self-pay

## 2020-07-22 ENCOUNTER — Encounter: Payer: Self-pay | Admitting: Family Medicine

## 2020-07-22 ENCOUNTER — Ambulatory Visit (INDEPENDENT_AMBULATORY_CARE_PROVIDER_SITE_OTHER): Payer: 59 | Admitting: Family Medicine

## 2020-07-22 VITALS — BP 143/83 | HR 69 | Ht 74.0 in | Wt 188.5 lb

## 2020-07-22 DIAGNOSIS — E559 Vitamin D deficiency, unspecified: Secondary | ICD-10-CM

## 2020-07-22 DIAGNOSIS — Z125 Encounter for screening for malignant neoplasm of prostate: Secondary | ICD-10-CM | POA: Diagnosis not present

## 2020-07-22 LAB — POCT URINALYSIS DIPSTICK
Appearance: NORMAL
Bilirubin, UA: NEGATIVE
Blood, UA: NEGATIVE
Glucose, UA: NEGATIVE
Ketones, UA: NEGATIVE
Leukocytes, UA: NEGATIVE
Nitrite, UA: NEGATIVE
Protein, UA: POSITIVE — AB
Spec Grav, UA: >=1.03 — AB (ref 1.010–1.025)
Urobilinogen, UA: NEGATIVE E.U./dL — AB
pH, UA: 5.5 (ref 5.0–8.0)

## 2020-07-22 NOTE — Assessment & Plan Note (Signed)
Recently Vitamin D level came back at 18. Plan- Patient will start 5000 iu of Vitamin D daily and recheck level in 6 months.

## 2020-07-23 LAB — PSA: PSA: 2.91 ng/mL (ref <=4.0)

## 2020-07-25 ENCOUNTER — Telehealth: Payer: Self-pay | Admitting: Neurology

## 2020-07-25 ENCOUNTER — Telehealth: Payer: Self-pay

## 2020-07-25 NOTE — Progress Notes (Signed)
Please call patient, his Vitamin D level was low at 18.7 (normal range is around 30-100 ng/ml). I would recommend that patient start an OTC Vitamin D supplement: 1000-2000 units daily of any vitamin D supplement of His choice should be fine. I would recommend recheck of vitamin D status in 3-6 months with PCP.  His other labs were benign, 1 test result was pending which is vitamin B6, we will update if abnormal.

## 2020-07-25 NOTE — Telephone Encounter (Signed)
-----   Message from Star Age, MD sent at 07/25/2020  1:21 PM EST ----- Please call patient, his Vitamin D level was low at 18.7 (normal range is around 30-100 ng/ml). I would recommend that patient start an OTC Vitamin D supplement: 1000-2000 units daily of any vitamin D supplement of His choice should be fine. I would recommend recheck of vitamin D status in 3-6 months with PCP.  His other labs were benign, 1 test result was pending which is vitamin B6, we will update if abnormal.

## 2020-07-25 NOTE — Telephone Encounter (Signed)
I called pt to discuss. No answer, left a message asking him to call me back. 

## 2020-07-25 NOTE — Telephone Encounter (Signed)
[employer] order sent to [facility]. They will obtain the auth and reach out to the patient to schedule.  

## 2020-07-26 LAB — COMPREHENSIVE METABOLIC PANEL
ALT: 14 IU/L (ref 0–44)
AST: 13 IU/L (ref 0–40)
Albumin/Globulin Ratio: 1.7 (ref 1.2–2.2)
Albumin: 4.3 g/dL (ref 3.8–4.9)
Alkaline Phosphatase: 102 IU/L (ref 44–121)
BUN/Creatinine Ratio: 12 (ref 9–20)
BUN: 12 mg/dL (ref 6–24)
Bilirubin Total: 0.3 mg/dL (ref 0.0–1.2)
CO2: 24 mmol/L (ref 20–29)
Calcium: 9.5 mg/dL (ref 8.7–10.2)
Chloride: 102 mmol/L (ref 96–106)
Creatinine, Ser: 1.02 mg/dL (ref 0.76–1.27)
GFR calc Af Amer: 93 mL/min/{1.73_m2} (ref >59)
GFR calc non Af Amer: 81 mL/min/{1.73_m2} (ref >59)
Globulin, Total: 2.5 g/dL (ref 1.5–4.5)
Glucose: 79 mg/dL (ref 65–99)
Potassium: 4.6 mmol/L (ref 3.5–5.2)
Sodium: 140 mmol/L (ref 134–144)
Total Protein: 6.8 g/dL (ref 6.0–8.5)

## 2020-07-26 LAB — B12 AND FOLATE PANEL
Folate: 8.4 ng/mL (ref >3.0)
Vitamin B-12: 454 pg/mL (ref 232–1245)

## 2020-07-26 LAB — VITAMIN B6: Vitamin B6: 11.7 ug/L (ref 5.3–46.7)

## 2020-07-26 LAB — C-REACTIVE PROTEIN: CRP: 2 mg/L (ref 0–10)

## 2020-07-26 LAB — HGB A1C W/O EAG: Hgb A1c MFr Bld: 5.6 % (ref 4.8–5.6)

## 2020-07-26 LAB — RHEUMATOID FACTOR: Rheumatoid fact SerPl-aCnc: <10 IU/mL (ref <14.0)

## 2020-07-26 LAB — VITAMIN D 25 HYDROXY (VIT D DEFICIENCY, FRACTURES): Vit D, 25-Hydroxy: 18.7 ng/mL — ABNORMAL LOW (ref 30.0–100.0)

## 2020-07-26 LAB — RPR: RPR Ser Ql: NONREACTIVE

## 2020-07-26 LAB — ANA W/REFLEX: Anti Nuclear Antibody (ANA): NEGATIVE

## 2020-07-26 LAB — SEDIMENTATION RATE: Sed Rate: 5 mm/hr (ref 0–30)

## 2020-07-26 NOTE — Telephone Encounter (Signed)
I called pt. Spoke with pt's wife, [place], per [place]. I discussed pt's lab results. Pt has already started vit d3 144mcg. He had a PCP appt on 07/22/20 and they discussed low vitamin D with them. I advised pt's wife that the MRI order was sent to GI and gave her their phone number. Pt's wife verbalized understanding of results and recommendations. Pt's wife had no questions at this time but was encouraged to call back if questions arise.

## 2020-07-26 NOTE — Telephone Encounter (Signed)
Pt's wife Otila Kluver returned call. Pt was with wife during phone call. Please call back when available. [place]

## 2020-07-29 NOTE — Progress Notes (Signed)
Established Patient Office Visit  SUBJECTIVE:  Subjective  Patient ID: Parker Conway, male    DOB: 1960-11-17  Age: 59 y.o. MRN: [id number]  CC:  Chief Complaint  Patient presents with  . Dizziness    HPI Parker Conway is a 59 y.o. male presenting today for     History reviewed. No pertinent past medical history.  Past Surgical History:  Procedure Laterality Date  . ANAL FISSURECTOMY    . COLONOSCOPY WITH PROPOFOL N/A 07/12/2020   Procedure: COLONOSCOPY WITH PROPOFOL;  Surgeon: Lucilla Lame, MD;  Location: [facility] ENDOSCOPY;  Service: Endoscopy;  Laterality: N/A;  . ESOPHAGOGASTRODUODENOSCOPY (EGD) WITH PROPOFOL N/A 07/12/2020   Procedure: ESOPHAGOGASTRODUODENOSCOPY (EGD) WITH PROPOFOL;  Surgeon: Lucilla Lame, MD;  Location: [facility] ENDOSCOPY;  Service: Endoscopy;  Laterality: N/A;    History reviewed. No pertinent family history.  Social History   Socioeconomic History  . Marital status: Married    Spouse name: Not on file  . Number of children: Not on file  . Years of education: Not on file  . Highest education level: Not on file  Occupational History  . Not on file  Tobacco Use  . Smoking status: Current Every Day Smoker    Packs/day: 1.00    Years: 22.00    Pack years: 22.00    Types: Cigarettes    Start date: 44  . Smokeless tobacco: Never Used  Substance and Sexual Activity  . Alcohol use: Yes    Alcohol/week: 7.0 standard drinks    Types: 7 Cans of beer per week  . Drug use: No  . Sexual activity: Not on file  Other Topics Concern  . Not on file  Social History Narrative  . Not on file   Social Determinants of Health   Financial Resource Strain: Not on file  Food Insecurity: Not on file  Transportation Needs: Not on file  Physical Activity: Not on file  Stress: Not on file  Social Connections: Not on file  Intimate Partner Violence: Not on file     Current Outpatient Medications:  .  b complex vitamins capsule, Take 1 capsule by  mouth daily., Disp: , Rfl:  .  dexlansoprazole (DEXILANT) 60 MG capsule, Take 1 capsule (60 mg total) by mouth daily. (Patient not taking: Reported on 07/21/2020), Disp: 30 capsule, Rfl: 6 .  dimenhyDRINATE (DRAMAMINE) 50 MG tablet, Take 0.5 tablets (25 mg total) by mouth every 8 (eight) hours as needed., Disp: 30 tablet, Rfl: 0 .  naproxen sodium (ALEVE) 220 MG tablet, Take 220 mg by mouth as needed. (Patient not taking: Reported on 07/21/2020), Disp: , Rfl:  .  omeprazole (PRILOSEC) 40 MG capsule, Take 40 mg by mouth daily., Disp: , Rfl:    No Known Allergies  ROS Review of Systems  Constitutional: Negative.   HENT: Negative.   Respiratory: Negative.   Cardiovascular: Negative.   Gastrointestinal: Negative.   Musculoskeletal: Negative.   Skin: Negative.   Allergic/Immunologic: Negative.   Neurological: Positive for dizziness.  Psychiatric/Behavioral: Negative.      OBJECTIVE:    Physical Exam Constitutional:      Appearance: Normal appearance.  HENT:     Head: Normocephalic.     Nose: Nose normal.     Mouth/Throat:     Mouth: Mucous membranes are moist.  Cardiovascular:     Rate and Rhythm: Normal rate and regular rhythm.  Musculoskeletal:        General: Normal range of motion.  Cervical back: Normal range of motion.  Skin:    General: Skin is warm.     Capillary Refill: Capillary refill takes less than 2 seconds.  Neurological:     Mental Status: He is alert.  Psychiatric:        Mood and Affect: Mood normal.     BP (!) 143/83   Pulse 69   Ht 6\' 2"  (1.88 m)   Wt 188 lb 8 oz (85.5 kg)   BMI 24.20 kg/m  Wt Readings from Last 3 Encounters:  07/22/20 188 lb 8 oz (85.5 kg)  07/21/20 188 lb (85.3 kg)  07/12/20 187 lb (84.8 kg)    Health Maintenance Due  Topic Date Due  . Hepatitis C Screening  Never done  . COVID-19 Vaccine (1) Never done  . HIV Screening  Never done  . TETANUS/TDAP  Never done  . INFLUENZA VACCINE  Never done    There are no  preventive care reminders to display for this patient.  CBC Latest Ref Rng & Units 05/02/2020  WBC 3.8 - 10.8 Thousand/uL 8.5  Hemoglobin 13.2 - 17.1 g/dL 13.7  Hematocrit 38.5 - 50.0 % 44.7  Platelets 140 - 400 Thousand/uL 283   CMP Latest Ref Rng & Units 07/21/2020 05/02/2020 02/04/2020  Glucose 65 - 99 mg/dL 79 96 88  BUN 6 - 24 mg/dL 12 13 10   Creatinine 0.76 - 1.27 mg/dL 1.02 1.00 0.90  Sodium 134 - 144 mmol/L 140 142 141  Potassium 3.5 - 5.2 mmol/L 4.6 4.6 4.4  Chloride 96 - 106 mmol/L 102 108 106  CO2 20 - 29 mmol/L 24 22 26   Calcium 8.7 - 10.2 mg/dL 9.5 9.1 8.9  Total Protein 6.0 - 8.5 g/dL 6.8 5.9(L) -  Total Bilirubin 0.0 - 1.2 mg/dL 0.3 0.3 -  Alkaline Phos 44 - 121 IU/L 102 - -  AST 0 - 40 IU/L 13 11 -  ALT 0 - 44 IU/L 14 8(L) -    Lab Results  Component Value Date   TSH 0.94 05/02/2020   Lab Results  Component Value Date   ALBUMIN 4.3 07/21/2020   ANIONGAP 9 02/04/2020   No results found for: CHOL, HDL, LDLCALC, CHOLHDL No results found for: TRIG Lab Results  Component Value Date   HGBA1C 5.6 07/21/2020      ASSESSMENT & PLAN:   Problem List Items Addressed This Visit      Other   Vitamin D deficiency - Primary    Recently Vitamin D level came back at 5. Plan- Patient will start 5000 iu of Vitamin D daily and recheck level in 6 months.        Other Visit Diagnoses    Prostate cancer screening       Relevant Orders   POCT urinalysis dipstick (Completed)   PSA (Completed)      No orders of the defined types were placed in this encounter.     Follow-up: No follow-ups on file.    Beckie Salts, [place] [address], [place], [place] [address]

## 2020-08-02 NOTE — Addendum Note (Signed)
Addended by: Beckie Salts on: 08/02/2020 11:08 AM   Modules accepted: Level of Service

## 2020-08-02 NOTE — Progress Notes (Signed)
Established Patient Office Visit  SUBJECTIVE:  Subjective  Patient ID: Parker Conway, male    DOB: June 01, 1961  Age: 59 y.o. MRN: [id number]  CC:  Chief Complaint  Patient presents with  . Dizziness    HPI Parker Conway is a 59 y.o. male presenting today for     History reviewed. No pertinent past medical history.  Past Surgical History:  Procedure Laterality Date  . ANAL FISSURECTOMY    . COLONOSCOPY WITH PROPOFOL N/A 07/12/2020   Procedure: COLONOSCOPY WITH PROPOFOL;  Surgeon: Lucilla Lame, MD;  Location: [facility] ENDOSCOPY;  Service: Endoscopy;  Laterality: N/A;  . ESOPHAGOGASTRODUODENOSCOPY (EGD) WITH PROPOFOL N/A 07/12/2020   Procedure: ESOPHAGOGASTRODUODENOSCOPY (EGD) WITH PROPOFOL;  Surgeon: Lucilla Lame, MD;  Location: [facility] ENDOSCOPY;  Service: Endoscopy;  Laterality: N/A;    History reviewed. No pertinent family history.  Social History   Socioeconomic History  . Marital status: Married    Spouse name: Not on file  . Number of children: Not on file  . Years of education: Not on file  . Highest education level: Not on file  Occupational History  . Not on file  Tobacco Use  . Smoking status: Current Every Day Smoker    Packs/day: 1.00    Years: 22.00    Pack years: 22.00    Types: Cigarettes    Start date: 29  . Smokeless tobacco: Never Used  Substance and Sexual Activity  . Alcohol use: Yes    Alcohol/week: 7.0 standard drinks    Types: 7 Cans of beer per week  . Drug use: No  . Sexual activity: Not on file  Other Topics Concern  . Not on file  Social History Narrative  . Not on file   Social Determinants of Health   Financial Resource Strain: Not on file  Food Insecurity: Not on file  Transportation Needs: Not on file  Physical Activity: Not on file  Stress: Not on file  Social Connections: Not on file  Intimate Partner Violence: Not on file     Current Outpatient Medications:  .  b complex vitamins capsule, Take 1 capsule by  mouth daily., Disp: , Rfl:  .  dexlansoprazole (DEXILANT) 60 MG capsule, Take 1 capsule (60 mg total) by mouth daily. (Patient not taking: Reported on 07/21/2020), Disp: 30 capsule, Rfl: 6 .  dimenhyDRINATE (DRAMAMINE) 50 MG tablet, Take 0.5 tablets (25 mg total) by mouth every 8 (eight) hours as needed., Disp: 30 tablet, Rfl: 0 .  naproxen sodium (ALEVE) 220 MG tablet, Take 220 mg by mouth as needed. (Patient not taking: Reported on 07/21/2020), Disp: , Rfl:  .  omeprazole (PRILOSEC) 40 MG capsule, Take 40 mg by mouth daily., Disp: , Rfl:    No Known Allergies  ROS Review of Systems  Constitutional: Negative.   HENT: Negative.   Respiratory: Negative.   Cardiovascular: Negative.   Genitourinary: Negative.   Musculoskeletal: Negative.   Psychiatric/Behavioral: Negative.   All other systems reviewed and are negative.    OBJECTIVE:    Physical Exam Vitals and nursing note reviewed.  Constitutional:      Appearance: Normal appearance.  HENT:     Head: Normocephalic.     Mouth/Throat:     Mouth: Mucous membranes are moist.  Cardiovascular:     Rate and Rhythm: Normal rate and regular rhythm.  Abdominal:     General: Abdomen is flat.  Musculoskeletal:        General: Normal range of motion.  Skin:    General: Skin is warm.  Neurological:     General: No focal deficit present.     Mental Status: He is alert.  Psychiatric:        Mood and Affect: Mood normal.     BP (!) 143/83   Pulse 69   Ht 6\' 2"  (1.88 m)   Wt 188 lb 8 oz (85.5 kg)   BMI 24.20 kg/m  Wt Readings from Last 3 Encounters:  07/22/20 188 lb 8 oz (85.5 kg)  07/21/20 188 lb (85.3 kg)  07/12/20 187 lb (84.8 kg)    Health Maintenance Due  Topic Date Due  . Hepatitis C Screening  Never done  . COVID-19 Vaccine (1) Never done  . HIV Screening  Never done  . TETANUS/TDAP  Never done  . INFLUENZA VACCINE  Never done    There are no preventive care reminders to display for this patient.  CBC Latest Ref  Rng & Units 05/02/2020  WBC 3.8 - 10.8 Thousand/uL 8.5  Hemoglobin 13.2 - 17.1 g/dL 13.7  Hematocrit 38.5 - 50.0 % 44.7  Platelets 140 - 400 Thousand/uL 283   CMP Latest Ref Rng & Units 07/21/2020 05/02/2020 02/04/2020  Glucose 65 - 99 mg/dL 79 96 88  BUN 6 - 24 mg/dL 12 13 10   Creatinine 0.76 - 1.27 mg/dL 1.02 1.00 0.90  Sodium 134 - 144 mmol/L 140 142 141  Potassium 3.5 - 5.2 mmol/L 4.6 4.6 4.4  Chloride 96 - 106 mmol/L 102 108 106  CO2 20 - 29 mmol/L 24 22 26   Calcium 8.7 - 10.2 mg/dL 9.5 9.1 8.9  Total Protein 6.0 - 8.5 g/dL 6.8 5.9(L) -  Total Bilirubin 0.0 - 1.2 mg/dL 0.3 0.3 -  Alkaline Phos 44 - 121 IU/L 102 - -  AST 0 - 40 IU/L 13 11 -  ALT 0 - 44 IU/L 14 8(L) -    Lab Results  Component Value Date   TSH 0.94 05/02/2020   Lab Results  Component Value Date   ALBUMIN 4.3 07/21/2020   ANIONGAP 9 02/04/2020   No results found for: CHOL, HDL, LDLCALC, CHOLHDL No results found for: TRIG Lab Results  Component Value Date   HGBA1C 5.6 07/21/2020      ASSESSMENT & PLAN:   Problem List Items Addressed This Visit      Other   Vitamin D deficiency - Primary    Recently Vitamin D level came back at 74. Plan- Patient will start 5000 iu of Vitamin D daily and recheck level in 6 months.        Other Visit Diagnoses    Prostate cancer screening       Relevant Orders   POCT urinalysis dipstick (Completed)   PSA (Completed)      No orders of the defined types were placed in this encounter.     Follow-up: No follow-ups on file.    Beckie Salts, [place] [address] [place], [place] [address]

## 2020-08-04 ENCOUNTER — Other Ambulatory Visit: Payer: Self-pay

## 2020-08-04 ENCOUNTER — Ambulatory Visit
Admission: RE | Admit: 2020-08-04 | Discharge: 2020-08-04 | Disposition: A | Discharge status: Home / Self Care | Payer: 59 | Source: Ambulatory Visit | Attending: Neurology | Admitting: Neurology

## 2020-08-04 DIAGNOSIS — Z87898 Personal history of other specified conditions: Secondary | ICD-10-CM

## 2020-08-04 DIAGNOSIS — R634 Abnormal weight loss: Secondary | ICD-10-CM

## 2020-08-04 DIAGNOSIS — R42 Dizziness and giddiness: Secondary | ICD-10-CM

## 2020-08-04 DIAGNOSIS — H532 Diplopia: Secondary | ICD-10-CM

## 2020-08-04 DIAGNOSIS — R251 Tremor, unspecified: Secondary | ICD-10-CM

## 2020-08-04 MED ORDER — GADOBENATE DIMEGLUMINE 529 MG/ML IV SOLN
17.0000 mL | Freq: Once | INTRAVENOUS | Status: AC | PRN
  Administered 2020-08-04: 17 mL via INTRAVENOUS

## 2020-08-07 NOTE — Progress Notes (Signed)
Please call patient and advise him that his brain MRI with and without contrast was reported as normal.

## 2020-08-08 ENCOUNTER — Telehealth: Payer: Self-pay

## 2020-08-08 NOTE — Telephone Encounter (Signed)
I called pt. I spoke to pt's wife, Ismat, per DPR. I advised her of normal MRI results. Pt will follow up with PCP as scheduled next week.Pt verbalized understanding of results. Pt had no questions at this time but was encouraged to call back if questions arise.

## 2020-08-08 NOTE — Telephone Encounter (Signed)
-----   Message from Huston Foley, MD sent at 08/07/2020 10:42 AM EST ----- Please call patient and advise him that his brain MRI with and without contrast was reported as normal.

## 2020-08-19 ENCOUNTER — Other Ambulatory Visit: Payer: Self-pay

## 2020-08-19 ENCOUNTER — Encounter: Payer: Self-pay | Admitting: Family Medicine

## 2020-08-19 ENCOUNTER — Ambulatory Visit (INDEPENDENT_AMBULATORY_CARE_PROVIDER_SITE_OTHER): Payer: 59 | Admitting: Family Medicine

## 2020-08-19 VITALS — BP 124/73 | HR 71 | Ht 74.0 in | Wt 186.4 lb

## 2020-08-19 DIAGNOSIS — F321 Major depressive disorder, single episode, moderate: Secondary | ICD-10-CM | POA: Diagnosis not present

## 2020-08-19 DIAGNOSIS — R630 Anorexia: Secondary | ICD-10-CM | POA: Diagnosis not present

## 2020-08-19 MED ORDER — CITALOPRAM HYDROBROMIDE 20 MG PO TABS: 20.0000 mg | ORAL_TABLET | Freq: Every day | ORAL | 3 refills | Status: DC

## 2020-08-19 NOTE — Progress Notes (Signed)
Established Patient Office Visit  SUBJECTIVE:  Subjective  Patient ID: Parker Conway, male    DOB: [birth year]  Age: 60 y.o. MRN: [id number]  CC:  Chief Complaint  Patient presents with  . Follow-up    Patient is here for a 1 month follow up. Patient reports he still has dizziness and decreased appetite     HPI Parker Conway is a 60 y.o. male presenting today for     History reviewed. No pertinent past medical history.  Past Surgical History:  Procedure Laterality Date  . ANAL FISSURECTOMY    . COLONOSCOPY WITH PROPOFOL N/A 07/12/2020   Procedure: COLONOSCOPY WITH PROPOFOL;  Surgeon: Lucilla Lame, MD;  Location: [facility] ENDOSCOPY;  Service: Endoscopy;  Laterality: N/A;  . ESOPHAGOGASTRODUODENOSCOPY (EGD) WITH PROPOFOL N/A 07/12/2020   Procedure: ESOPHAGOGASTRODUODENOSCOPY (EGD) WITH PROPOFOL;  Surgeon: Lucilla Lame, MD;  Location: [facility] ENDOSCOPY;  Service: Endoscopy;  Laterality: N/A;    History reviewed. No pertinent family history.  Social History   Socioeconomic History  . Marital status: Married    Spouse name: Not on file  . Number of children: Not on file  . Years of education: Not on file  . Highest education level: Not on file  Occupational History  . Not on file  Tobacco Use  . Smoking status: Current Every Day Smoker    Packs/day: 1.00    Years: 22.00    Pack years: 22.00    Types: Cigarettes    Start date: 49  . Smokeless tobacco: Never Used  Substance and Sexual Activity  . Alcohol use: Yes    Alcohol/week: 7.0 standard drinks    Types: 7 Cans of beer per week  . Drug use: No  . Sexual activity: Not on file  Other Topics Concern  . Not on file  Social History Narrative  . Not on file   Social Determinants of Health   Financial Resource Strain: Not on file  Food Insecurity: Not on file  Transportation Needs: Not on file  Physical Activity: Not on file  Stress: Not on file  Social Connections: Not on file  Intimate Partner  Violence: Not on file     Current Outpatient Medications:  .  citalopram (CELEXA) 20 MG tablet, Take 1 tablet (20 mg total) by mouth daily., Disp: 30 tablet, Rfl: 3 .  b complex vitamins capsule, Take 1 capsule by mouth daily., Disp: , Rfl:  .  dexlansoprazole (DEXILANT) 60 MG capsule, Take 1 capsule (60 mg total) by mouth daily. (Patient not taking: Reported on 07/21/2020), Disp: 30 capsule, Rfl: 6 .  dimenhyDRINATE (DRAMAMINE) 50 MG tablet, Take 0.5 tablets (25 mg total) by mouth every 8 (eight) hours as needed., Disp: 30 tablet, Rfl: 0 .  naproxen sodium (ALEVE) 220 MG tablet, Take 220 mg by mouth as needed. (Patient not taking: Reported on 07/21/2020), Disp: , Rfl:  .  omeprazole (PRILOSEC) 40 MG capsule, Take 40 mg by mouth daily., Disp: , Rfl:    No Known Allergies  ROS Review of Systems   OBJECTIVE:    Physical Exam  BP 124/73   Pulse 71   Ht 6\' 2"  (1.88 m)   Wt 186 lb 6.4 oz (84.6 kg)   BMI 23.93 kg/m  Wt Readings from Last 3 Encounters:  08/19/20 186 lb 6.4 oz (84.6 kg)  07/22/20 188 lb 8 oz (85.5 kg)  07/21/20 188 lb (85.3 kg)    Health Maintenance Due  Topic Date Due  . Hepatitis  C Screening  Never done  . COVID-19 Vaccine (1) Never done  . HIV Screening  Never done  . TETANUS/TDAP  Never done  . INFLUENZA VACCINE  Never done    There are no preventive care reminders to display for this patient.  CBC Latest Ref Rng & Units 05/02/2020  WBC 3.8 - 10.8 Thousand/uL 8.5  Hemoglobin 13.2 - 17.1 g/dL 13.7  Hematocrit 38.5 - 50.0 % 44.7  Platelets 140 - 400 Thousand/uL 283   CMP Latest Ref Rng & Units 07/21/2020 05/02/2020 02/04/2020  Glucose 65 - 99 mg/dL 79 96 88  BUN 6 - 24 mg/dL 12 13 10   Creatinine 0.76 - 1.27 mg/dL 1.02 1.00 0.90  Sodium 134 - 144 mmol/L 140 142 141  Potassium 3.5 - 5.2 mmol/L 4.6 4.6 4.4  Chloride 96 - 106 mmol/L 102 108 106  CO2 20 - 29 mmol/L 24 22 26   Calcium 8.7 - 10.2 mg/dL 9.5 9.1 8.9  Total Protein 6.0 - 8.5 g/dL 6.8 5.9(L) -   Total Bilirubin 0.0 - 1.2 mg/dL 0.3 0.3 -  Alkaline Phos 44 - 121 IU/L 102 - -  AST 0 - 40 IU/L 13 11 -  ALT 0 - 44 IU/L 14 8(L) -    Lab Results  Component Value Date   TSH 0.94 05/02/2020   Lab Results  Component Value Date   ALBUMIN 4.3 07/21/2020   ANIONGAP 9 02/04/2020   No results found for: CHOL, HDL, LDLCALC, CHOLHDL No results found for: TRIG Lab Results  Component Value Date   HGBA1C 5.6 07/21/2020      ASSESSMENT & PLAN:   Problem List Items Addressed This Visit      Other   Appetite loss - Primary    Pt with long term history of appetite loss, work up with GI has been completed. Asked him and his spouse about Depression and she feels like he may be severely depressed with increased stress.        Other Visit Diagnoses    Current moderate episode of major depressive disorder without prior episode ([place])       Relevant Medications   citalopram (CELEXA) 20 MG tablet      Meds ordered this encounter  Medications  . citalopram (CELEXA) 20 MG tablet    Sig: Take 1 tablet (20 mg total) by mouth daily.    Dispense:  30 tablet    Refill:  3      Follow-up: No follow-ups on file.    Beckie Salts, [place] [address] [place], [place] [address]

## 2020-08-19 NOTE — Assessment & Plan Note (Signed)
Pt with long term history of appetite loss, work up with GI has been completed. Asked him and his spouse about Depression and she feels like he may be severely depressed with increased stress.

## 2020-09-22 ENCOUNTER — Encounter: Payer: Self-pay | Admitting: Family Medicine

## 2020-09-22 ENCOUNTER — Ambulatory Visit (INDEPENDENT_AMBULATORY_CARE_PROVIDER_SITE_OTHER): Payer: 59 | Admitting: Family Medicine

## 2020-09-22 VITALS — BP 122/73 | HR 78 | Ht 74.0 in | Wt 185.9 lb

## 2020-09-22 DIAGNOSIS — R634 Abnormal weight loss: Secondary | ICD-10-CM

## 2020-09-22 NOTE — Progress Notes (Signed)
Established Patient Office Visit  SUBJECTIVE:  Subjective  Patient ID: Parker Conway, male    DOB: 12-10-1960  Age: 60 y.o. MRN: [id number]  CC:  Chief Complaint  Patient presents with  . Nausea    Patient still having nausea, with no appetite. Patient states that he feels like he forces himself to eat.     HPI Parker Conway is a 60 y.o. male presenting today for     History reviewed. No pertinent past medical history.  Past Surgical History:  Procedure Laterality Date  . ANAL FISSURECTOMY    . COLONOSCOPY WITH PROPOFOL N/A 07/12/2020   Procedure: COLONOSCOPY WITH PROPOFOL;  Surgeon: Lucilla Lame, MD;  Location: [facility] ENDOSCOPY;  Service: Endoscopy;  Laterality: N/A;  . ESOPHAGOGASTRODUODENOSCOPY (EGD) WITH PROPOFOL N/A 07/12/2020   Procedure: ESOPHAGOGASTRODUODENOSCOPY (EGD) WITH PROPOFOL;  Surgeon: Lucilla Lame, MD;  Location: [facility] ENDOSCOPY;  Service: Endoscopy;  Laterality: N/A;    History reviewed. No pertinent family history.  Social History   Socioeconomic History  . Marital status: Married    Spouse name: Not on file  . Number of children: Not on file  . Years of education: Not on file  . Highest education level: Not on file  Occupational History  . Not on file  Tobacco Use  . Smoking status: Current Every Day Smoker    Packs/day: 1.00    Years: 22.00    Pack years: 22.00    Types: Cigarettes    Start date: 12  . Smokeless tobacco: Never Used  Substance and Sexual Activity  . Alcohol use: Yes    Alcohol/week: 7.0 standard drinks    Types: 7 Cans of beer per week  . Drug use: No  . Sexual activity: Not on file  Other Topics Concern  . Not on file  Social History Narrative  . Not on file   Social Determinants of Health   Financial Resource Strain: Not on file  Food Insecurity: Not on file  Transportation Needs: Not on file  Physical Activity: Not on file  Stress: Not on file  Social Connections: Not on file  Intimate Partner  Violence: Not on file     Current Outpatient Medications:  .  b complex vitamins capsule, Take 1 capsule by mouth daily., Disp: , Rfl:  .  citalopram (CELEXA) 20 MG tablet, Take 1 tablet (20 mg total) by mouth daily., Disp: 30 tablet, Rfl: 3 .  dimenhyDRINATE (DRAMAMINE) 50 MG tablet, Take 0.5 tablets (25 mg total) by mouth every 8 (eight) hours as needed., Disp: 30 tablet, Rfl: 0 .  naproxen sodium (ALEVE) 220 MG tablet, Take 220 mg by mouth as needed., Disp: , Rfl:  .  omeprazole (PRILOSEC) 40 MG capsule, Take 40 mg by mouth daily., Disp: , Rfl:  .  dexlansoprazole (DEXILANT) 60 MG capsule, Take 1 capsule (60 mg total) by mouth daily. (Patient not taking: Reported on 07/21/2020), Disp: 30 capsule, Rfl: 6   No Known Allergies  ROS Review of Systems  Constitutional: Positive for activity change, appetite change and fatigue.  HENT: Negative.   Respiratory: Negative.   Cardiovascular: Negative.   Genitourinary: Negative.   Musculoskeletal: Negative.   Neurological: Negative.      OBJECTIVE:    Physical Exam Vitals and nursing note reviewed.  HENT:     Head: Normocephalic.  Cardiovascular:     Rate and Rhythm: Normal rate and regular rhythm.  Pulmonary:     Effort: Pulmonary effort is normal.  Musculoskeletal:  General: Normal range of motion.  Neurological:     Mental Status: He is alert.  Psychiatric:        Mood and Affect: Mood normal.     BP 122/73   Pulse 78   Ht 6\' 2"  (1.88 m)   Wt 185 lb 14.4 oz (84.3 kg)   BMI 23.87 kg/m  Wt Readings from Last 3 Encounters:  09/22/20 185 lb 14.4 oz (84.3 kg)  08/19/20 186 lb 6.4 oz (84.6 kg)  07/22/20 188 lb 8 oz (85.5 kg)    Health Maintenance Due  Topic Date Due  . Hepatitis C Screening  Never done  . COVID-19 Vaccine (1) Never done  . HIV Screening  Never done  . TETANUS/TDAP  Never done  . INFLUENZA VACCINE  Never done    There are no preventive care reminders to display for this patient.  CBC Latest  Ref Rng & Units 05/02/2020  WBC 3.8 - 10.8 Thousand/uL 8.5  Hemoglobin 13.2 - 17.1 g/dL 13.7  Hematocrit 38.5 - 50.0 % 44.7  Platelets 140 - 400 Thousand/uL 283   CMP Latest Ref Rng & Units 07/21/2020 05/02/2020 02/04/2020  Glucose 65 - 99 mg/dL 79 96 88  BUN 6 - 24 mg/dL 12 13 10   Creatinine 0.76 - 1.27 mg/dL 1.02 1.00 0.90  Sodium 134 - 144 mmol/L 140 142 141  Potassium 3.5 - 5.2 mmol/L 4.6 4.6 4.4  Chloride 96 - 106 mmol/L 102 108 106  CO2 20 - 29 mmol/L 24 22 26   Calcium 8.7 - 10.2 mg/dL 9.5 9.1 8.9  Total Protein 6.0 - 8.5 g/dL 6.8 5.9(L) -  Total Bilirubin 0.0 - 1.2 mg/dL 0.3 0.3 -  Alkaline Phos 44 - 121 IU/L 102 - -  AST 0 - 40 IU/L 13 11 -  ALT 0 - 44 IU/L 14 8(L) -    Lab Results  Component Value Date   TSH 0.94 05/02/2020   Lab Results  Component Value Date   ALBUMIN 4.3 07/21/2020   ANIONGAP 9 02/04/2020   No results found for: CHOL, HDL, LDLCALC, CHOLHDL No results found for: TRIG Lab Results  Component Value Date   HGBA1C 5.6 07/21/2020      ASSESSMENT & PLAN:   Problem List Items Addressed This Visit      Other   Weight loss - Primary    Patient in today to fu with weight loss and continued nausea, he has lost 3 lbs in 2 months and he has been trying to gain weight. Plan- I am going to order [place] ABD complete to r/o any signs of malignancy. He has had Colonoscopy and Endoscopy that were normal.          No orders of the defined types were placed in this encounter.     Follow-up: No follow-ups on file.    Beckie Salts, [place] [address], [place], [place] [address]

## 2020-09-22 NOTE — Assessment & Plan Note (Signed)
Patient in today to fu with weight loss and continued nausea, he has lost 3 lbs in 2 months and he has been trying to gain weight. Plan- I am going to order [place] ABD complete to r/o any signs of malignancy. He has had Colonoscopy and Endoscopy that were normal.

## 2020-09-27 ENCOUNTER — Other Ambulatory Visit: Payer: Self-pay

## 2020-09-27 MED ORDER — DICLOFENAC SODIUM 1 % EX GEL: 2.0000 g | Freq: Four times a day (QID) | CUTANEOUS | 0 refills | Status: DC | PRN

## 2020-09-28 ENCOUNTER — Other Ambulatory Visit: Payer: Self-pay

## 2020-09-28 DIAGNOSIS — R634 Abnormal weight loss: Secondary | ICD-10-CM

## 2020-09-28 DIAGNOSIS — R040 Epistaxis: Secondary | ICD-10-CM

## 2020-10-04 ENCOUNTER — Other Ambulatory Visit: Payer: Self-pay

## 2020-10-04 ENCOUNTER — Ambulatory Visit
Admission: RE | Admit: 2020-10-04 | Discharge: 2020-10-04 | Disposition: A | Discharge status: Home / Self Care | Payer: 59 | Source: Ambulatory Visit | Attending: Family Medicine | Admitting: Family Medicine

## 2020-10-04 DIAGNOSIS — R634 Abnormal weight loss: Secondary | ICD-10-CM | POA: Diagnosis present

## 2020-11-18 ENCOUNTER — Other Ambulatory Visit: Payer: Self-pay

## 2020-11-18 ENCOUNTER — Encounter: Payer: Self-pay | Admitting: Family Medicine

## 2020-11-18 ENCOUNTER — Ambulatory Visit (INDEPENDENT_AMBULATORY_CARE_PROVIDER_SITE_OTHER): Payer: Self-pay | Admitting: Family Medicine

## 2020-11-18 VITALS — BP 135/80 | HR 60 | Ht 74.0 in | Wt 185.4 lb

## 2020-11-18 DIAGNOSIS — F419 Anxiety disorder, unspecified: Secondary | ICD-10-CM | POA: Insufficient documentation

## 2020-11-18 DIAGNOSIS — G8929 Other chronic pain: Secondary | ICD-10-CM

## 2020-11-18 MED ORDER — MELOXICAM 15 MG PO TABS: 15.0000 mg | ORAL_TABLET | Freq: Every day | ORAL | 0 refills | Status: DC

## 2020-11-18 NOTE — Progress Notes (Signed)
Established Patient Office Visit  SUBJECTIVE:  Subjective  Patient ID: Parker Conway, male    DOB: [birth year]  Age: 60 y.o. MRN: [id number]  CC:  Chief Complaint  Patient presents with  . Follow-up    Pt here for 2 month follow up     HPI Parker Conway is a 60 y.o. male presenting today for     History reviewed. No pertinent past medical history.  Past Surgical History:  Procedure Laterality Date  . ANAL FISSURECTOMY    . COLONOSCOPY WITH PROPOFOL N/A 07/12/2020   Procedure: COLONOSCOPY WITH PROPOFOL;  Surgeon: Lucilla Lame, MD;  Location: [facility] ENDOSCOPY;  Service: Endoscopy;  Laterality: N/A;  . ESOPHAGOGASTRODUODENOSCOPY (EGD) WITH PROPOFOL N/A 07/12/2020   Procedure: ESOPHAGOGASTRODUODENOSCOPY (EGD) WITH PROPOFOL;  Surgeon: Lucilla Lame, MD;  Location: [facility] ENDOSCOPY;  Service: Endoscopy;  Laterality: N/A;    History reviewed. No pertinent family history.  Social History   Socioeconomic History  . Marital status: Married    Spouse name: Not on file  . Number of children: Not on file  . Years of education: Not on file  . Highest education level: Not on file  Occupational History  . Not on file  Tobacco Use  . Smoking status: Current Every Day Smoker    Packs/day: 1.00    Years: 22.00    Pack years: 22.00    Types: Cigarettes    Start date: 35  . Smokeless tobacco: Never Used  Substance and Sexual Activity  . Alcohol use: Yes    Alcohol/week: 7.0 standard drinks    Types: 7 Cans of beer per week  . Drug use: No  . Sexual activity: Not on file  Other Topics Concern  . Not on file  Social History Narrative  . Not on file   Social Determinants of Health   Financial Resource Strain: Not on file  Food Insecurity: Not on file  Transportation Needs: Not on file  Physical Activity: Not on file  Stress: Not on file  Social Connections: Not on file  Intimate Partner Violence: Not on file     Current Outpatient Medications:  .  b complex  vitamins capsule, Take 1 capsule by mouth daily., Disp: , Rfl:  .  citalopram (CELEXA) 20 MG tablet, Take 1 tablet (20 mg total) by mouth daily., Disp: 30 tablet, Rfl: 3 .  dexlansoprazole (DEXILANT) 60 MG capsule, Take 1 capsule (60 mg total) by mouth daily. (Patient not taking: Reported on 07/21/2020), Disp: 30 capsule, Rfl: 6 .  diclofenac Sodium (VOLTAREN) 1 % GEL, Apply 2 g topically 4 (four) times daily as needed., Disp: 50 g, Rfl: 0 .  dimenhyDRINATE (DRAMAMINE) 50 MG tablet, Take 0.5 tablets (25 mg total) by mouth every 8 (eight) hours as needed., Disp: 30 tablet, Rfl: 0 .  omeprazole (PRILOSEC) 40 MG capsule, Take 40 mg by mouth daily., Disp: , Rfl:    No Known Allergies  ROS Review of Systems  Constitutional: Negative.   HENT: Negative.   Respiratory: Negative.   Cardiovascular: Negative.   Musculoskeletal: Negative.   Psychiatric/Behavioral: Negative.      OBJECTIVE:    Physical Exam Constitutional:      Appearance: He is ill-appearing.  Cardiovascular:     Rate and Rhythm: Normal rate and regular rhythm.  Abdominal:     General: Abdomen is flat.  Musculoskeletal:        General: Normal range of motion.  Skin:    General: Skin is warm.  Psychiatric:        Mood and Affect: Mood normal.     BP 135/80   Pulse 60   Ht 6\' 2"  (1.88 m)   Wt 185 lb 6.4 oz (84.1 kg)   BMI 23.80 kg/m  Wt Readings from Last 3 Encounters:  11/18/20 185 lb 6.4 oz (84.1 kg)  09/22/20 185 lb 14.4 oz (84.3 kg)  08/19/20 186 lb 6.4 oz (84.6 kg)    Health Maintenance Due  Topic Date Due  . Hepatitis C Screening  Never done  . COVID-19 Vaccine (1) Never done  . HIV Screening  Never done  . TETANUS/TDAP  Never done    There are no preventive care reminders to display for this patient.  CBC Latest Ref Rng & Units 05/02/2020  WBC 3.8 - 10.8 Thousand/uL 8.5  Hemoglobin 13.2 - 17.1 g/dL 13.7  Hematocrit 38.5 - 50.0 % 44.7  Platelets 140 - 400 Thousand/uL 283   CMP Latest Ref Rng &  Units 07/21/2020 05/02/2020 02/04/2020  Glucose 65 - 99 mg/dL 79 96 88  BUN 6 - 24 mg/dL 12 13 10   Creatinine 0.76 - 1.27 mg/dL 1.02 1.00 0.90  Sodium 134 - 144 mmol/L 140 142 141  Potassium 3.5 - 5.2 mmol/L 4.6 4.6 4.4  Chloride 96 - 106 mmol/L 102 108 106  CO2 20 - 29 mmol/L 24 22 26   Calcium 8.7 - 10.2 mg/dL 9.5 9.1 8.9  Total Protein 6.0 - 8.5 g/dL 6.8 5.9(L) -  Total Bilirubin 0.0 - 1.2 mg/dL 0.3 0.3 -  Alkaline Phos 44 - 121 IU/L 102 - -  AST 0 - 40 IU/L 13 11 -  ALT 0 - 44 IU/L 14 8(L) -    Lab Results  Component Value Date   TSH 0.94 05/02/2020   Lab Results  Component Value Date   ALBUMIN 4.3 07/21/2020   ANIONGAP 9 02/04/2020   No results found for: CHOL, HDL, LDLCALC, CHOLHDL No results found for: TRIG Lab Results  Component Value Date   HGBA1C 5.6 07/21/2020      ASSESSMENT & PLAN:   Problem List Items Addressed This Visit      Other   Anxiety - Primary    Continued anxiety which is worsening since he was fired from his job last month.   Plan- Continue SSRI         No orders of the defined types were placed in this encounter.     Follow-up: No follow-ups on file.    Beckie Salts, [place] [address] [place], [place] [address]

## 2020-11-18 NOTE — Assessment & Plan Note (Addendum)
Continued anxiety which is worsening since he was fired from his job last month.   Plan- Continue SSRI

## 2020-11-18 NOTE — Addendum Note (Signed)
Addended by: Beckie Salts on: 11/18/2020 03:23 PM   Modules accepted: Orders

## 2020-11-18 NOTE — Addendum Note (Signed)
Addended by: Alois Cliche on: 11/18/2020 03:26 PM   Modules accepted: Orders

## 2020-11-19 ENCOUNTER — Other Ambulatory Visit: Payer: Self-pay

## 2020-11-21 ENCOUNTER — Other Ambulatory Visit: Payer: Self-pay | Admitting: *Deleted

## 2020-11-21 MED ORDER — DICLOFENAC SODIUM 1 % EX GEL
2.0000 g | Freq: Four times a day (QID) | CUTANEOUS | 0 refills | Status: DC | PRN
Start: 2020-11-21 — End: 2023-05-24

## 2020-11-21 MED ORDER — DICLOFENAC SODIUM 50 MG PO TBEC: 50.0000 mg | DELAYED_RELEASE_TABLET | Freq: Two times a day (BID) | ORAL | 1 refills | Status: DC

## 2020-11-21 MED ORDER — DICLOFENAC SODIUM 3 % EX GEL: CUTANEOUS | 6 refills | Status: DC

## 2020-12-07 ENCOUNTER — Other Ambulatory Visit: Payer: Self-pay | Admitting: *Deleted

## 2020-12-07 DIAGNOSIS — M1710 Unilateral primary osteoarthritis, unspecified knee: Secondary | ICD-10-CM

## 2020-12-16 ENCOUNTER — Other Ambulatory Visit: Payer: Self-pay | Admitting: Family Medicine

## 2020-12-19 ENCOUNTER — Other Ambulatory Visit: Payer: Self-pay | Admitting: *Deleted

## 2020-12-19 MED ORDER — MELOXICAM 15 MG PO TABS: 15.0000 mg | ORAL_TABLET | Freq: Every day | ORAL | 3 refills | Status: AC

## 2020-12-19 MED ORDER — MELOXICAM 15 MG PO TABS
15.0000 mg | ORAL_TABLET | Freq: Every day | ORAL | 0 refills | Status: DC
Start: 2020-12-19 — End: 2020-12-19

## 2021-01-25 ENCOUNTER — Other Ambulatory Visit: Payer: Self-pay | Admitting: Family Medicine

## 2022-04-11 IMAGING — CT CT NECK W/ CM
4 of 5 series · 14 of 33 positions shown, 16 images · IV contrast (omnipaque)
Comparison: None.

CLINICAL DATA: Syncope, recurrent.

Neck pain, chronic.
EXAM:
CT HEAD WITHOUT CONTRAST
CT NECK WITH CONTRAST
TECHNIQUE: Contiguous axial images were obtained from the base of the skull
through the vertex without intravenous contrast. Multidetector CT
imaging of the and neck was performed using the standard protocol
following the bolus administration of intravenous contrast.
CONTRAST:  75mL OMNIPAQUE IOHEXOL 300 MG/ML  SOLN

[Series 2: axial neck neck (person_name) 2.00 · axial · 0.66mm/px · z∈[-668,-578]mm · 2 of 135 slices shown]
[im 45/135  bone]
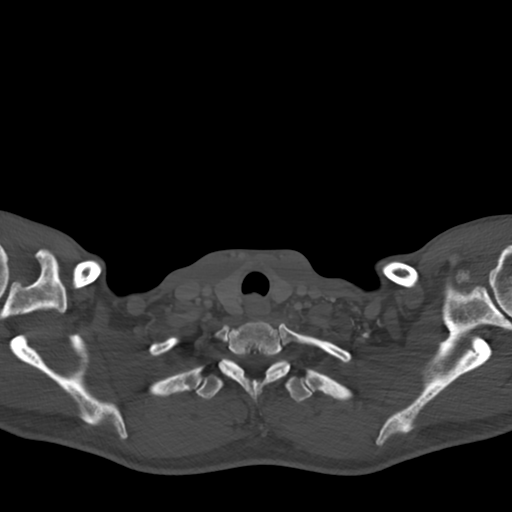
[im 90/135  bone]
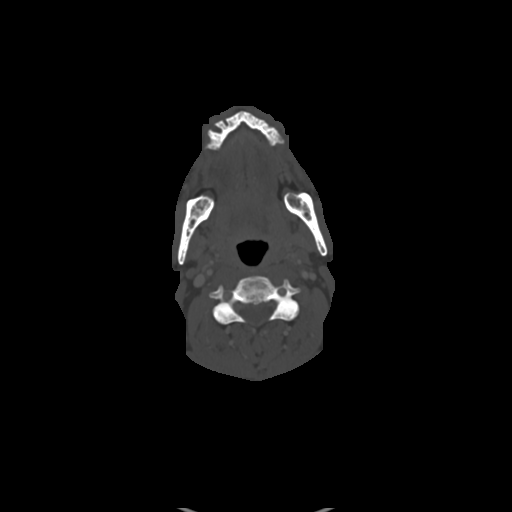

[Series 4: coronal neck neck (person_name) 2.00 cor · coronal · 0.55mm/px · 3 of 131 slices shown]
[im 48/131  bone]
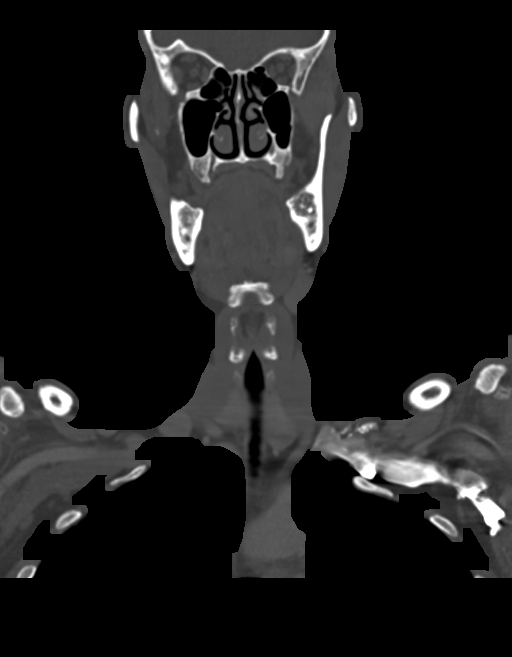
[im 60/131  bone]
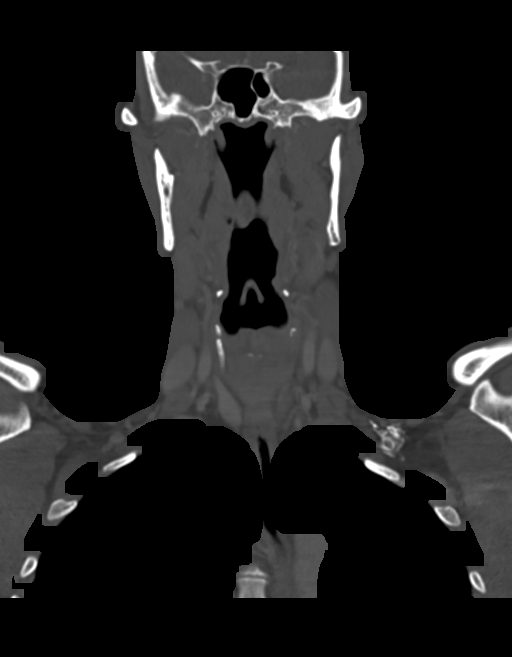
[im 71/131  bone]
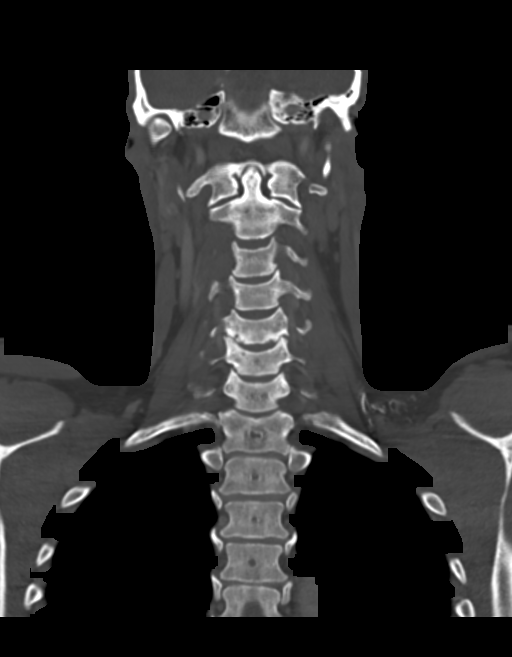

[Series 6: sagittal neck neck (person_name) 2.00 sag · sagittal · 0.52mm/px · 5 of 141 slices shown, 6 images]
[im 47/141  bone]
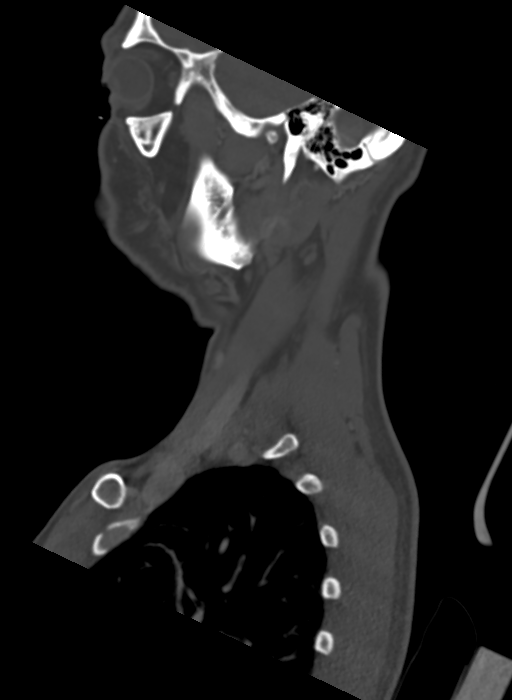
[im 59/141  bone]
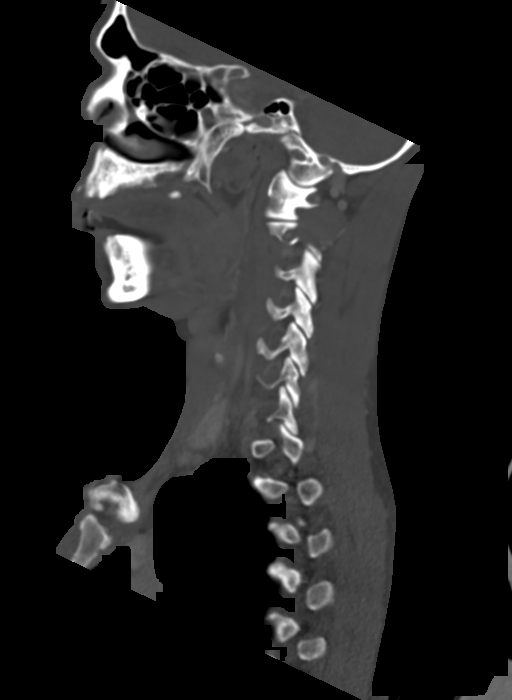
[im 71/141  soft-tissue]
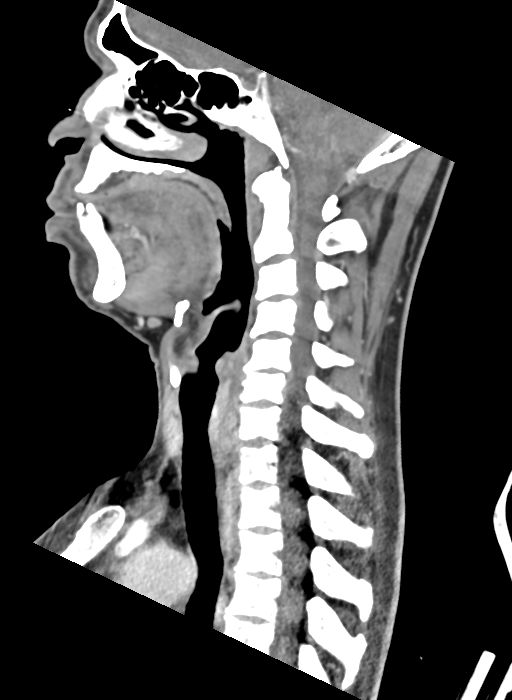
[im 71/141  bone]
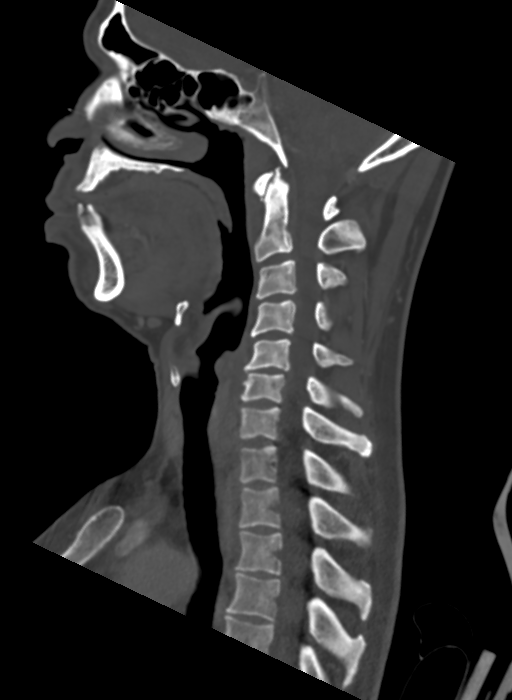
[im 82/141  bone]
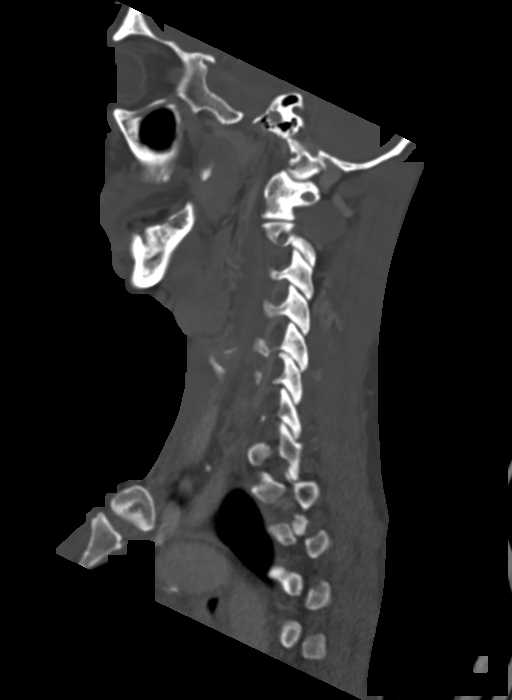
[im 94/141  bone]
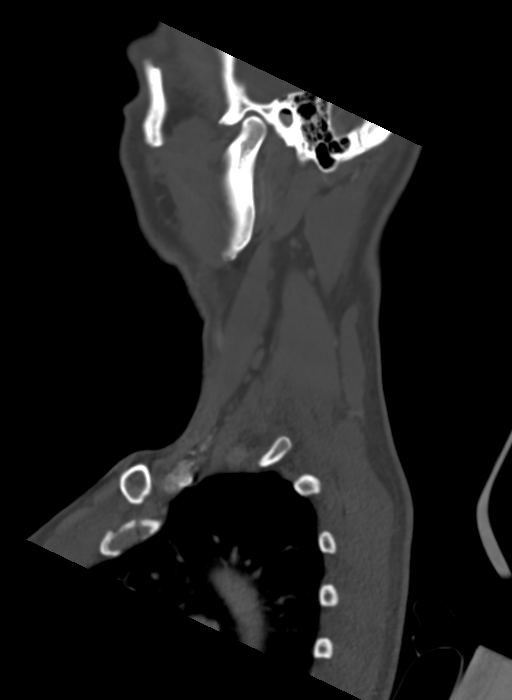

[Series 8: ax oropharynx neck neck (person_name) 2.00 ax · axial · 0.52mm/px · z∈[-772,-578]mm · 4 of 180 slices shown, 5 images]
[im 36/180  soft-tissue]
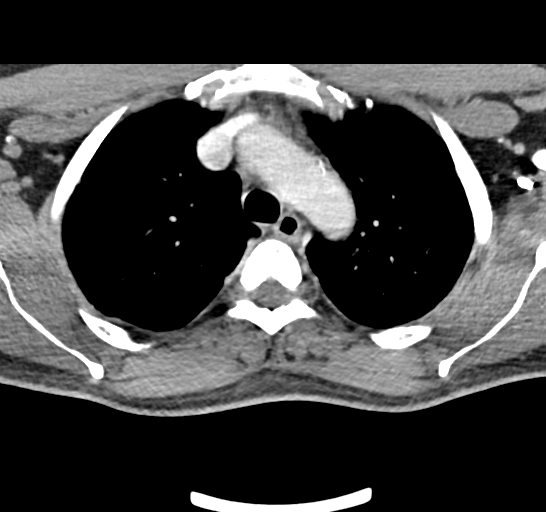
[im 36/180  bone]
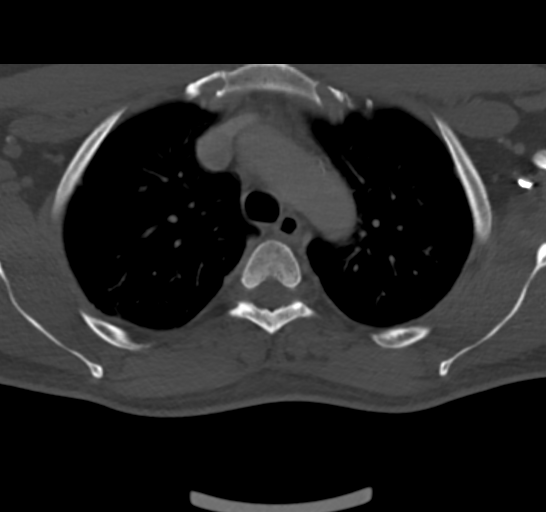
[im 72/180  bone]
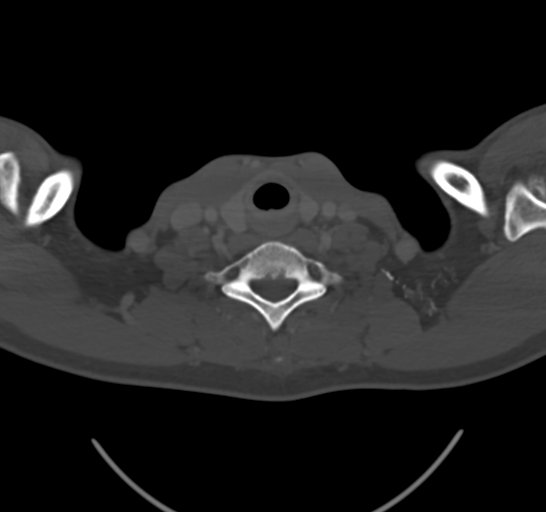
[im 108/180  bone]
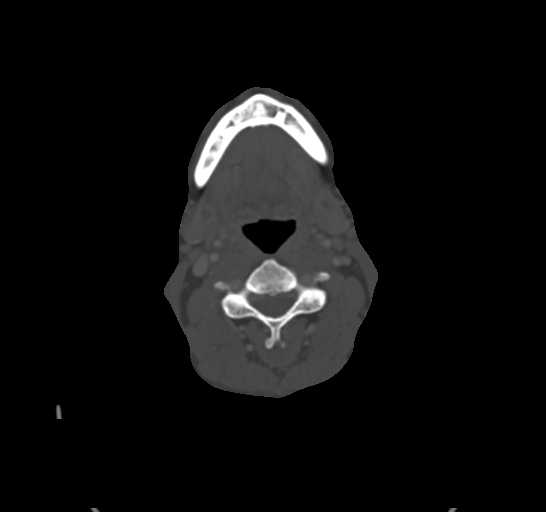
[im 144/180  bone]
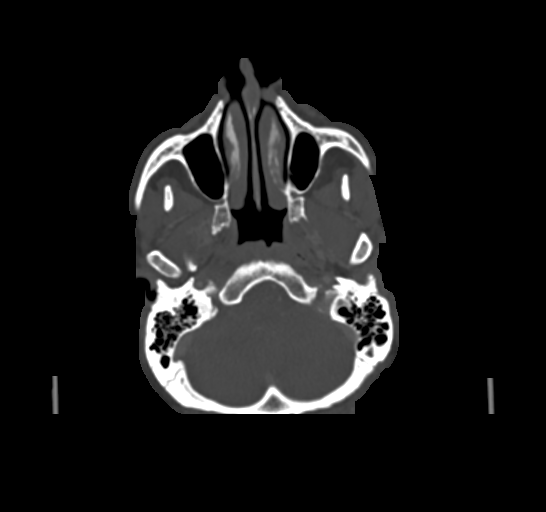

[14 of 33 positions shown; findings below may reference images not displayed]

FINDINGS: CT HEAD FINDINGS

Brain: No evidence of acute infarction, hemorrhage, hydrocephalus,
extra-axial collection or mass lesion/mass effect.

Vascular: No hyperdense vessel or unexpected calcification. Visible
vessels are patent.

Skull: Left parietal burr hole. Negative for fracture or aggressive
lesion.

Sinuses/Orbits: No acute finding. Soft tissue within the left
external auditory canal, consistent with cerumen.

Other: None.

CT NECK FINDINGS

Pharynx and larynx: Normal. No mass or swelling.

Salivary glands: No inflammation, mass, or stone.

Thyroid: Normal.

Lymph nodes: None enlarged or abnormal density.

Vascular: Atherosclerotic changes in the bilateral carotid
bifurcation with soft plaques. No hemodynamically significant
stenosis.

Mastoids and visualized paranasal sinuses: Clear.

Skeleton: Sternoclavicular, acromioclavicular, glenohumeral and
cervical spine degenerative changes. No aggressive or acute process
identified.

Upper chest: Paraseptal emphysema.

Other: None.
IMPRESSION: 1. No acute intracranial pathology.
2. No mass or lymphadenopathy in the neck.
3. Paraseptal emphysema.
4. Atherosclerotic changes in the bilateral carotid bifurcation with
soft plaques. No hemodynamically significant stenosis.

Emphysema ([id number]).

## 2022-05-14 ENCOUNTER — Encounter (HOSPITAL_COMMUNITY): Payer: Self-pay

## 2022-05-14 ENCOUNTER — Other Ambulatory Visit: Payer: Self-pay

## 2022-05-14 ENCOUNTER — Ambulatory Visit: Payer: 59 | Admitting: Internal Medicine

## 2022-05-14 ENCOUNTER — Emergency Department (HOSPITAL_COMMUNITY)
Admission: EM | Admit: 2022-05-14 | Discharge: 2022-05-15 | Discharge status: Against Medical Advice | Payer: 59 | Attending: Emergency Medicine | Admitting: Emergency Medicine

## 2022-05-14 DIAGNOSIS — M7989 Other specified soft tissue disorders: Secondary | ICD-10-CM | POA: Insufficient documentation

## 2022-05-14 DIAGNOSIS — M79644 Pain in right finger(s): Secondary | ICD-10-CM | POA: Insufficient documentation

## 2022-05-14 DIAGNOSIS — Z5321 Procedure and treatment not carried out due to patient leaving prior to being seen by health care provider: Secondary | ICD-10-CM | POA: Insufficient documentation

## 2022-05-14 NOTE — ED Triage Notes (Signed)
Pt to er, pt states that he is here for pain and swelling to his R thumb, states that it has been hurting for the past 4-5 days, states that he thinks that he got something under the nail.

## 2022-05-15 ENCOUNTER — Ambulatory Visit (INDEPENDENT_AMBULATORY_CARE_PROVIDER_SITE_OTHER): Payer: 59 | Admitting: Internal Medicine

## 2022-05-15 ENCOUNTER — Encounter: Payer: Self-pay | Admitting: Internal Medicine

## 2022-05-15 ENCOUNTER — Ambulatory Visit: Payer: Self-pay | Admitting: Internal Medicine

## 2022-05-15 ENCOUNTER — Other Ambulatory Visit: Payer: Self-pay | Admitting: *Deleted

## 2022-05-15 VITALS — BP 130/83 | HR 64 | Ht 74.0 in | Wt 169.3 lb

## 2022-05-15 DIAGNOSIS — L03011 Cellulitis of right finger: Secondary | ICD-10-CM | POA: Insufficient documentation

## 2022-05-15 MED ORDER — CEPHALEXIN 500 MG PO CAPS: 500.0000 mg | ORAL_CAPSULE | Freq: Three times a day (TID) | ORAL | 0 refills | Status: DC

## 2022-05-15 NOTE — Progress Notes (Signed)
Established Patient Office Visit  Subjective:  Patient ID: Parker Conway, male    DOB: [birth year]  Age: 61 y.o. MRN: [id number]  CC:  Chief Complaint  Patient presents with   thumb pain    Patient has swollen thumb of right hand and is severely tender and painful    HPI  Parker Conway presents for infected   rt big thumb, h/o clamp injury  History reviewed. No pertinent past medical history.  Past Surgical History:  Procedure Laterality Date   ANAL FISSURECTOMY     COLONOSCOPY WITH PROPOFOL N/A 07/12/2020   Procedure: COLONOSCOPY WITH PROPOFOL;  Surgeon: Lucilla Lame, MD;  Location: [facility] ENDOSCOPY;  Service: Endoscopy;  Laterality: N/A;   ESOPHAGOGASTRODUODENOSCOPY (EGD) WITH PROPOFOL N/A 07/12/2020   Procedure: ESOPHAGOGASTRODUODENOSCOPY (EGD) WITH PROPOFOL;  Surgeon: Lucilla Lame, MD;  Location: [facility] ENDOSCOPY;  Service: Endoscopy;  Laterality: N/A;    History reviewed. No pertinent family history.  Social History   Socioeconomic History   Marital status: Married    Spouse name: Not on file   Number of children: Not on file   Years of education: Not on file   Highest education level: Not on file  Occupational History   Not on file  Tobacco Use   Smoking status: Every Day    Packs/day: 1.00    Years: 22.00    Total pack years: 22.00    Types: Cigarettes    Start date: 1979   Smokeless tobacco: Never  Vaping Use   Vaping Use: Never used  Substance and Sexual Activity   Alcohol use: Yes    Alcohol/week: 7.0 standard drinks of alcohol    Types: 7 Cans of beer per week   Drug use: No   Sexual activity: Not on file  Other Topics Concern   Not on file  Social History Narrative   Not on file   Social Determinants of Health   Financial Resource Strain: Not on file  Food Insecurity: Not on file  Transportation Needs: Not on file  Physical Activity: Not on file  Stress: Not on file  Social Connections: Not on file  Intimate Partner Violence: Not on  file     Current Outpatient Medications:    cephALEXin (KEFLEX) 500 MG capsule, Take 1 capsule (500 mg total) by mouth 3 (three) times daily., Disp: 30 capsule, Rfl: 0   b complex vitamins capsule, Take 1 capsule by mouth daily., Disp: , Rfl:    citalopram (CELEXA) 20 MG tablet, Take 1 tablet by mouth once daily, Disp: 30 tablet, Rfl: 0   dexlansoprazole (DEXILANT) 60 MG capsule, Take 1 capsule (60 mg total) by mouth daily. (Patient not taking: Reported on 07/21/2020), Disp: 30 capsule, Rfl: 6   diclofenac (VOLTAREN) 50 MG EC tablet, Take 1 tablet (50 mg total) by mouth 2 (two) times daily., Disp: 60 tablet, Rfl: 1   diclofenac Sodium (VOLTAREN) 1 % GEL, Apply 2 g topically 4 (four) times daily as needed., Disp: 50 g, Rfl: 0   dimenhyDRINATE (DRAMAMINE) 50 MG tablet, Take 0.5 tablets (25 mg total) by mouth every 8 (eight) hours as needed., Disp: 30 tablet, Rfl: 0   meloxicam (MOBIC) 15 MG tablet, Take 1 tablet (15 mg total) by mouth daily., Disp: 30 tablet, Rfl: 3   omeprazole (PRILOSEC) 40 MG capsule, Take 40 mg by mouth daily., Disp: , Rfl:    No Known Allergies  ROS Review of Systems  Constitutional: Negative.   HENT: Negative.  Eyes: Negative.   Respiratory: Negative.    Cardiovascular: Negative.   Gastrointestinal: Negative.   Endocrine: Negative.   Genitourinary: Negative.   Musculoskeletal: Negative.        Rt thumb swelling  Skin: Negative.   Allergic/Immunologic: Negative.   Neurological: Negative.   Hematological: Negative.   Psychiatric/Behavioral: Negative.    All other systems reviewed and are negative.     Objective:    Physical Exam Vitals reviewed.  Constitutional:      Appearance: Normal appearance.  HENT:     Mouth/Throat:     Mouth: Mucous membranes are moist.  Eyes:     Pupils: Pupils are equal, round, and reactive to light.  Neck:     Vascular: No carotid bruit.  Cardiovascular:     Rate and Rhythm: Normal rate and regular rhythm.     Pulses:  Normal pulses.     Heart sounds: Normal heart sounds.  Pulmonary:     Effort: Pulmonary effort is normal.     Breath sounds: Normal breath sounds.  Abdominal:     General: Bowel sounds are normal.     Palpations: Abdomen is soft. There is no hepatomegaly, splenomegaly or mass.     Tenderness: There is no abdominal tenderness.     Hernia: No hernia is present.  Musculoskeletal:       Hands:     Cervical back: Neck supple.     Right lower leg: No edema.     Left lower leg: No edema.     Comments: Rt thumb swelling  Skin:    Findings: No rash.  Neurological:     Mental Status: He is alert and oriented to person, place, and time.     Motor: No weakness.  Psychiatric:        Mood and Affect: Mood normal.        Behavior: Behavior normal.     BP 130/83   Pulse 64   Ht '6\' 2"'$  (1.88 m)   Wt 169 lb 4.8 oz (76.8 kg)   BMI 21.74 kg/m  Wt Readings from Last 3 Encounters:  05/15/22 169 lb 4.8 oz (76.8 kg)  05/14/22 175 lb (79.4 kg)  11/18/20 185 lb 6.4 oz (84.1 kg)     Health Maintenance Due  Topic Date Due   COVID-19 Vaccine (1) Never done   HIV Screening  Never done   Hepatitis C Screening  Never done   TETANUS/TDAP  Never done   Zoster Vaccines- Shingrix (1 of 2) Never done   INFLUENZA VACCINE  Never done    There are no preventive care reminders to display for this patient.  Lab Results  Component Value Date   TSH 0.94 05/02/2020   Lab Results  Component Value Date   WBC 8.5 05/02/2020   HGB 13.7 05/02/2020   HCT 44.7 05/02/2020   MCV 74.3 (L) 05/02/2020   PLT 283 05/02/2020   Lab Results  Component Value Date   NA 140 07/21/2020   K 4.6 07/21/2020   CO2 24 07/21/2020   GLUCOSE 79 07/21/2020   BUN 12 07/21/2020   CREATININE 1.02 07/21/2020   BILITOT 0.3 07/21/2020   ALKPHOS 102 07/21/2020   AST 13 07/21/2020   ALT 14 07/21/2020   PROT 6.8 07/21/2020   ALBUMIN 4.3 07/21/2020   CALCIUM 9.5 07/21/2020   ANIONGAP 9 02/04/2020   No results found for:  "CHOL" No results found for: "HDL" No results found for: "LDLCALC" No results found  for: "TRIG" No results found for: "CHOLHDL" Lab Results  Component Value Date   HGBA1C 5.6 07/21/2020      Assessment & Plan:   Problem List Items Addressed This Visit       Other   Cellulitis of right thumb - Primary    Patient is referred to Ortho       No orders of the defined types were placed in this encounter.   Follow-up: No follow-ups on file.    Cletis Athens, MD

## 2022-05-15 NOTE — Assessment & Plan Note (Signed)
Patient is referred to Ortho

## 2022-05-15 NOTE — ED Notes (Signed)
Patient left before being seen.

## 2022-05-15 NOTE — ED Notes (Signed)
Patient came out of the room wanting to know when he would be seen by the physician. The patient was informed of that the provider was seeing other patients currently and did not have a time frame to give him at the moment.

## 2022-10-11 ENCOUNTER — Encounter: Payer: Self-pay | Admitting: Radiology

## 2023-05-07 DIAGNOSIS — K056 Periodontal disease, unspecified: Secondary | ICD-10-CM | POA: Diagnosis not present

## 2023-05-07 DIAGNOSIS — Z8249 Family history of ischemic heart disease and other diseases of the circulatory system: Secondary | ICD-10-CM | POA: Diagnosis not present

## 2023-05-07 DIAGNOSIS — Z809 Family history of malignant neoplasm, unspecified: Secondary | ICD-10-CM | POA: Diagnosis not present

## 2023-05-07 DIAGNOSIS — Z972 Presence of dental prosthetic device (complete) (partial): Secondary | ICD-10-CM | POA: Diagnosis not present

## 2023-05-07 DIAGNOSIS — Z5986 Financial insecurity: Secondary | ICD-10-CM | POA: Diagnosis not present

## 2023-05-22 ENCOUNTER — Emergency Department (HOSPITAL_COMMUNITY): Payer: Medicare HMO

## 2023-05-22 ENCOUNTER — Other Ambulatory Visit: Payer: Self-pay

## 2023-05-22 ENCOUNTER — Encounter (HOSPITAL_COMMUNITY): Payer: Self-pay

## 2023-05-22 ENCOUNTER — Emergency Department (HOSPITAL_COMMUNITY)
Admission: EM | Admit: 2023-05-22 | Discharge: 2023-05-22 | Disposition: A | Discharge status: Home / Self Care | Payer: Medicare HMO | Attending: Emergency Medicine | Admitting: Emergency Medicine

## 2023-05-22 DIAGNOSIS — Z23 Encounter for immunization: Secondary | ICD-10-CM | POA: Insufficient documentation

## 2023-05-22 DIAGNOSIS — S61231A Puncture wound without foreign body of left index finger without damage to nail, initial encounter: Secondary | ICD-10-CM

## 2023-05-22 DIAGNOSIS — W3400XA Accidental discharge from unspecified firearms or gun, initial encounter: Secondary | ICD-10-CM | POA: Insufficient documentation

## 2023-05-22 DIAGNOSIS — S61201A Unspecified open wound of left index finger without damage to nail, initial encounter: Secondary | ICD-10-CM | POA: Insufficient documentation

## 2023-05-22 DIAGNOSIS — S6992XA Unspecified injury of left wrist, hand and finger(s), initial encounter: Secondary | ICD-10-CM | POA: Diagnosis present

## 2023-05-22 DIAGNOSIS — S62631B Displaced fracture of distal phalanx of left index finger, initial encounter for open fracture: Secondary | ICD-10-CM | POA: Diagnosis not present

## 2023-05-22 MED ORDER — OXYCODONE-ACETAMINOPHEN 5-325 MG PO TABS
1.0000 | ORAL_TABLET | Freq: Three times a day (TID) | ORAL | 0 refills | Status: AC | PRN
Start: 2023-05-22 — End: ?

## 2023-05-22 MED ORDER — NAPROXEN 500 MG PO TABS: 500.0000 mg | ORAL_TABLET | Freq: Two times a day (BID) | ORAL | 0 refills | Status: DC

## 2023-05-22 MED ORDER — TETANUS-DIPHTH-ACELL PERTUSSIS 5-2.5-18.5 LF-MCG/0.5 IM SUSY
0.5000 mL | PREFILLED_SYRINGE | Freq: Once | INTRAMUSCULAR | Status: AC
  Administered 2023-05-22: 0.5 mL via INTRAMUSCULAR
  Filled 2023-05-22: qty 0.5

## 2023-05-22 MED ORDER — MUPIROCIN 2 % EX OINT: 1.0000 | TOPICAL_OINTMENT | Freq: Two times a day (BID) | CUTANEOUS | 0 refills | Status: AC

## 2023-05-22 MED ORDER — BACITRACIN ZINC 500 UNIT/GM EX OINT
TOPICAL_OINTMENT | Freq: Two times a day (BID) | CUTANEOUS | Status: DC
  Filled 2023-05-22: qty 0.9

## 2023-05-22 MED ORDER — OXYCODONE-ACETAMINOPHEN 5-325 MG PO TABS
2.0000 | ORAL_TABLET | Freq: Once | ORAL | Status: AC
  Administered 2023-05-22: 2 via ORAL
  Filled 2023-05-22: qty 2

## 2023-05-22 MED ORDER — BACITRACIN ZINC 500 UNIT/GM EX OINT
TOPICAL_OINTMENT | Freq: Two times a day (BID) | CUTANEOUS | Status: DC
  Filled 2023-05-22: qty 1.8

## 2023-05-22 NOTE — Discharge Instructions (Signed)
Please keep a sterile dressing on your wound at all times, topical antibiotics such as mupirocin, you can take Naprosyn twice a day for pain, Percocet 1 tablet every 8 hours for severe pain but this can cause nausea and constipation.  ER for severe worsening swelling fever or spreading redness.  Dr. Romeo Apple will be able to see you this coming week.
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
## 2023-05-24 ENCOUNTER — Encounter: Payer: Self-pay | Admitting: Orthopedic Surgery

## 2023-05-24 ENCOUNTER — Ambulatory Visit: Payer: Medicare HMO | Admitting: Orthopedic Surgery

## 2023-05-24 VITALS — BP 132/74 | HR 57 | Ht 74.0 in | Wt 167.0 lb

## 2023-05-24 DIAGNOSIS — S61231A Puncture wound without foreign body of left index finger without damage to nail, initial encounter: Secondary | ICD-10-CM

## 2023-05-24 MED ORDER — HYDROCODONE-ACETAMINOPHEN 10-325 MG PO TABS: 1.0000 | ORAL_TABLET | Freq: Four times a day (QID) | ORAL | 0 refills | Status: AC | PRN

## 2023-05-24 MED ORDER — CEPHALEXIN 500 MG PO CAPS
500.0000 mg | ORAL_CAPSULE | Freq: Two times a day (BID) | ORAL | 0 refills | Status: AC
Start: 2023-05-24 — End: ?

## 2023-05-24 NOTE — Patient Instructions (Signed)
We are referring you to [facility] from [facility] address is [address] [place] The phone number is [phone]  The office will call you with an appointment Dr. Fara Boros
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
## 2023-05-27 ENCOUNTER — Ambulatory Visit: Payer: Medicare HMO | Admitting: Orthopedic Surgery

## 2023-05-27 DIAGNOSIS — S61231A Puncture wound without foreign body of left index finger without damage to nail, initial encounter: Secondary | ICD-10-CM
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
## 2023-07-03 ENCOUNTER — Other Ambulatory Visit (INDEPENDENT_AMBULATORY_CARE_PROVIDER_SITE_OTHER): Payer: Self-pay

## 2023-07-03 ENCOUNTER — Ambulatory Visit: Payer: Medicare HMO | Admitting: Orthopedic Surgery

## 2023-07-03 DIAGNOSIS — S61231A Puncture wound without foreign body of left index finger without damage to nail, initial encounter: Secondary | ICD-10-CM
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
## 2023-08-12 ENCOUNTER — Other Ambulatory Visit (INDEPENDENT_AMBULATORY_CARE_PROVIDER_SITE_OTHER): Payer: Medicare HMO

## 2023-08-12 ENCOUNTER — Ambulatory Visit: Payer: Medicare HMO | Admitting: Orthopedic Surgery

## 2023-08-12 DIAGNOSIS — S61231A Puncture wound without foreign body of left index finger without damage to nail, initial encounter: Secondary | ICD-10-CM | POA: Diagnosis not present
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
## 2024-03-20 DIAGNOSIS — H6123 Impacted cerumen, bilateral: Secondary | ICD-10-CM | POA: Diagnosis not present

## 2024-03-31 DIAGNOSIS — H9012 Conductive hearing loss, unilateral, left ear, with unrestricted hearing on the contralateral side: Secondary | ICD-10-CM | POA: Diagnosis not present

## 2024-03-31 DIAGNOSIS — H6122 Impacted cerumen, left ear: Secondary | ICD-10-CM | POA: Diagnosis not present

## 2024-06-15 ENCOUNTER — Encounter: Payer: Self-pay | Admitting: Radiology
